# Patient Record
Sex: Female | Born: 1955 | Race: White | Hispanic: No | Marital: Married | Smoking: Former smoker
Health system: Southern US, Community
[De-identification: ages and names within clinical notes are randomized; demographics above are authoritative.]

## PROBLEM LIST (undated history)

## (undated) DIAGNOSIS — F32A Depression, unspecified: Secondary | ICD-10-CM

## (undated) DIAGNOSIS — F329 Major depressive disorder, single episode, unspecified: Secondary | ICD-10-CM

## (undated) DIAGNOSIS — D329 Benign neoplasm of meninges, unspecified: Secondary | ICD-10-CM

## (undated) DIAGNOSIS — R61 Generalized hyperhidrosis: Secondary | ICD-10-CM

## (undated) DIAGNOSIS — J349 Unspecified disorder of nose and nasal sinuses: Secondary | ICD-10-CM

## (undated) DIAGNOSIS — M1990 Unspecified osteoarthritis, unspecified site: Secondary | ICD-10-CM

## (undated) DIAGNOSIS — IMO0001 Reserved for inherently not codable concepts without codable children: Secondary | ICD-10-CM

## (undated) DIAGNOSIS — K219 Gastro-esophageal reflux disease without esophagitis: Secondary | ICD-10-CM

## (undated) HISTORY — DX: Gastro-esophageal reflux disease without esophagitis: K21.9

## (undated) HISTORY — PX: APPENDECTOMY: SHX54

## (undated) HISTORY — DX: Generalized hyperhidrosis: R61

## (undated) HISTORY — DX: Reserved for inherently not codable concepts without codable children: IMO0001

## (undated) HISTORY — PX: OTHER SURGICAL HISTORY: SHX169

## (undated) HISTORY — DX: Benign neoplasm of meninges, unspecified: D32.9

## (undated) HISTORY — DX: Unspecified disorder of nose and nasal sinuses: J34.9

## (undated) HISTORY — DX: Unspecified osteoarthritis, unspecified site: M19.90

## (undated) HISTORY — PX: TONSILLECTOMY AND ADENOIDECTOMY: SHX28

## (undated) HISTORY — PX: CHOLECYSTECTOMY: SHX55

---

## 2005-01-12 ENCOUNTER — Ambulatory Visit: Payer: Self-pay | Admitting: Internal Medicine

## 2013-09-04 ENCOUNTER — Encounter: Payer: Self-pay | Admitting: Podiatry

## 2013-09-16 ENCOUNTER — Encounter: Payer: Self-pay | Admitting: Podiatry

## 2013-09-16 ENCOUNTER — Ambulatory Visit (INDEPENDENT_AMBULATORY_CARE_PROVIDER_SITE_OTHER): Payer: BC Managed Care – PPO

## 2013-09-16 ENCOUNTER — Ambulatory Visit (INDEPENDENT_AMBULATORY_CARE_PROVIDER_SITE_OTHER): Payer: BC Managed Care – PPO | Admitting: Podiatry

## 2013-09-16 VITALS — BP 120/82 | HR 70 | Resp 16

## 2013-09-16 DIAGNOSIS — M722 Plantar fascial fibromatosis: Secondary | ICD-10-CM

## 2013-09-16 MED ORDER — TRIAMCINOLONE ACETONIDE 10 MG/ML IJ SUSP
10.0000 mg | Freq: Once | INTRAMUSCULAR | Status: AC
  Administered 2013-09-16: 10 mg

## 2013-09-16 NOTE — Patient Instructions (Signed)
Plantar Fasciitis (Heel Spur Syndrome) with Rehab The plantar fascia is a fibrous, ligament-like, soft-tissue structure that spans the bottom of the foot. Plantar fasciitis is a condition that causes pain in the foot due to inflammation of the tissue. SYMPTOMS   Pain and tenderness on the underneath side of the foot.  Pain that worsens with standing or walking. CAUSES  Plantar fasciitis is caused by irritation and injury to the plantar fascia on the underneath side of the foot. Common mechanisms of injury include:  Direct trauma to bottom of the foot.  Damage to a small nerve that runs under the foot where the main fascia attaches to the heel bone.  Stress placed on the plantar fascia due to bone spurs. RISK INCREASES WITH:   Activities that place stress on the plantar fascia (running, jumping, pivoting, or cutting).  Poor strength and flexibility.  Improperly fitted shoes.  Tight calf muscles.  Flat feet.  Failure to warm-up properly before activity.  Obesity. PREVENTION  Warm up and stretch properly before activity.  Allow for adequate recovery between workouts.  Maintain physical fitness:  Strength, flexibility, and endurance.  Cardiovascular fitness.  Maintain a health body weight.  Avoid stress on the plantar fascia.  Wear properly fitted shoes, including arch supports for individuals who have flat feet. PROGNOSIS  If treated properly, then the symptoms of plantar fasciitis usually resolve without surgery. However, occasionally surgery is necessary. RELATED COMPLICATIONS   Recurrent symptoms that may result in a chronic condition.  Problems of the lower back that are caused by compensating for the injury, such as limping.  Pain or weakness of the foot during push-off following surgery.  Chronic inflammation, scarring, and partial or complete fascia tear, occurring more often from repeated injections. TREATMENT  Treatment initially involves the use of  ice and medication to help reduce pain and inflammation. The use of strengthening and stretching exercises may help reduce pain with activity, especially stretches of the Achilles tendon. These exercises may be performed at home or with a therapist. Your caregiver may recommend that you use heel cups of arch supports to help reduce stress on the plantar fascia. Occasionally, corticosteroid injections are given to reduce inflammation. If symptoms persist for greater than 6 months despite non-surgical (conservative), then surgery may be recommended.  MEDICATION   If pain medication is necessary, then nonsteroidal anti-inflammatory medications, such as aspirin and ibuprofen, or other minor pain relievers, such as acetaminophen, are often recommended.  Do not take pain medication within 7 days before surgery.  Prescription pain relievers may be given if deemed necessary by your caregiver. Use only as directed and only as much as you need.  Corticosteroid injections may be given by your caregiver. These injections should be reserved for the most serious cases, because they may only be given a certain number of times. HEAT AND COLD  Cold treatment (icing) relieves pain and reduces inflammation. Cold treatment should be applied for 10 to 15 minutes every 2 to 3 hours for inflammation and pain and immediately after any activity that aggravates your symptoms. Use ice packs or massage the area with a piece of ice (ice massage).  Heat treatment may be used prior to performing the stretching and strengthening activities prescribed by your caregiver, physical therapist, or athletic trainer. Use a heat pack or soak the injury in warm water. SEEK IMMEDIATE MEDICAL CARE IF:  Treatment seems to offer no benefit, or the condition worsens.  Any medications produce adverse side effects. EXERCISES RANGE   OF MOTION (ROM) AND STRETCHING EXERCISES - Plantar Fasciitis (Heel Spur Syndrome) These exercises may help you  when beginning to rehabilitate your injury. Your symptoms may resolve with or without further involvement from your physician, physical therapist or athletic trainer. While completing these exercises, remember:   Restoring tissue flexibility helps normal motion to return to the joints. This allows healthier, less painful movement and activity.  An effective stretch should be held for at least 30 seconds.  A stretch should never be painful. You should only feel a gentle lengthening or release in the stretched tissue. RANGE OF MOTION - Toe Extension, Flexion  Sit with your right / left leg crossed over your opposite knee.  Grasp your toes and gently pull them back toward the top of your foot. You should feel a stretch on the bottom of your toes and/or foot.  Hold this stretch for __________ seconds.  Now, gently pull your toes toward the bottom of your foot. You should feel a stretch on the top of your toes and or foot.  Hold this stretch for __________ seconds. Repeat __________ times. Complete this stretch __________ times per day.  RANGE OF MOTION - Ankle Dorsiflexion, Active Assisted  Remove shoes and sit on a chair that is preferably not on a carpeted surface.  Place right / left foot under knee. Extend your opposite leg for support.  Keeping your heel down, slide your right / left foot back toward the chair until you feel a stretch at your ankle or calf. If you do not feel a stretch, slide your bottom forward to the edge of the chair, while still keeping your heel down.  Hold this stretch for __________ seconds. Repeat __________ times. Complete this stretch __________ times per day.  STRETCH - Gastroc, Standing  Place hands on wall.  Extend right / left leg, keeping the front knee somewhat bent.  Slightly point your toes inward on your back foot.  Keeping your right / left heel on the floor and your knee straight, shift your weight toward the wall, not allowing your back to  arch.  You should feel a gentle stretch in the right / left calf. Hold this position for __________ seconds. Repeat __________ times. Complete this stretch __________ times per day. STRETCH - Soleus, Standing  Place hands on wall.  Extend right / left leg, keeping the other knee somewhat bent.  Slightly point your toes inward on your back foot.  Keep your right / left heel on the floor, bend your back knee, and slightly shift your weight over the back leg so that you feel a gentle stretch deep in your back calf.  Hold this position for __________ seconds. Repeat __________ times. Complete this stretch __________ times per day. STRETCH - Gastrocsoleus, Standing  Note: This exercise can place a lot of stress on your foot and ankle. Please complete this exercise only if specifically instructed by your caregiver.   Place the ball of your right / left foot on a step, keeping your other foot firmly on the same step.  Hold on to the wall or a rail for balance.  Slowly lift your other foot, allowing your body weight to press your heel down over the edge of the step.  You should feel a stretch in your right / left calf.  Hold this position for __________ seconds.  Repeat this exercise with a slight bend in your right / left knee. Repeat __________ times. Complete this stretch __________ times per day.    STRENGTHENING EXERCISES - Plantar Fasciitis (Heel Spur Syndrome)  These exercises may help you when beginning to rehabilitate your injury. They may resolve your symptoms with or without further involvement from your physician, physical therapist or athletic trainer. While completing these exercises, remember:   Muscles can gain both the endurance and the strength needed for everyday activities through controlled exercises.  Complete these exercises as instructed by your physician, physical therapist or athletic trainer. Progress the resistance and repetitions only as guided. STRENGTH -  Towel Curls  Sit in a chair positioned on a non-carpeted surface.  Place your foot on a towel, keeping your heel on the floor.  Pull the towel toward your heel by only curling your toes. Keep your heel on the floor.  If instructed by your physician, physical therapist or athletic trainer, add ____________________ at the end of the towel. Repeat __________ times. Complete this exercise __________ times per day. STRENGTH - Ankle Inversion  Secure one end of a rubber exercise band/tubing to a fixed object (table, pole). Loop the other end around your foot just before your toes.  Place your fists between your knees. This will focus your strengthening at your ankle.  Slowly, pull your big toe up and in, making sure the band/tubing is positioned to resist the entire motion.  Hold this position for __________ seconds.  Have your muscles resist the band/tubing as it slowly pulls your foot back to the starting position. Repeat __________ times. Complete this exercises __________ times per day.  Document Released: 02/13/2005 Document Revised: 05/08/2011 Document Reviewed: 05/28/2008 [employer] Patient Information 2015 [employer]. This information is not intended to replace advice given to you by your health care provider. Make sure you discuss any questions you have with your health care provider.  

## 2013-09-18 NOTE — Progress Notes (Signed)
Subjective:     Patient ID: Krista Phelps, female   DOB: 07/04/1955, 58 y.o.   MRN: [id number]  HPI patient presents with significant recurrence of heel pain right stating that it hurts when she tries to walk on it and she feels like she has not may progress   Review of Systems     Objective:   Physical Exam Neurovascular status intact with significant recurrence of plantar fascia right heel at the insertional point of the tendon into the calcaneus    Assessment:     Reoccurrence of plantar fasciitis right heel with inflammation and fluid around the medial band    Plan:     Discussed condition and at this point advised on aggressive physical therapy shoe gear modification and scanned for custom orthotics to reduce the long-term stress on the heel and reduced mechanical forces. Did inject the right plantar fascia 3 mg Kenalog 5 mg I can Marcaine mixture at this time

## 2013-10-07 ENCOUNTER — Ambulatory Visit (INDEPENDENT_AMBULATORY_CARE_PROVIDER_SITE_OTHER): Payer: BC Managed Care – PPO | Admitting: *Deleted

## 2013-10-07 VITALS — BP 120/82 | HR 70 | Resp 16 | Ht 64.0 in | Wt 160.0 lb

## 2013-10-07 DIAGNOSIS — M722 Plantar fascial fibromatosis: Secondary | ICD-10-CM

## 2013-10-07 NOTE — Progress Notes (Signed)
Pt presents to pick up orthotics. Went over wearing instructions with pt. 

## 2013-10-07 NOTE — Patient Instructions (Signed)

## 2014-08-18 ENCOUNTER — Encounter: Payer: Self-pay | Admitting: Family Medicine

## 2014-08-18 ENCOUNTER — Ambulatory Visit (INDEPENDENT_AMBULATORY_CARE_PROVIDER_SITE_OTHER): Payer: BLUE CROSS/BLUE SHIELD | Admitting: Family Medicine

## 2014-08-18 ENCOUNTER — Telehealth: Payer: Self-pay | Admitting: Family Medicine

## 2014-08-18 VITALS — BP 142/81 | HR 79 | Temp 98.8°F | Ht 63.0 in | Wt 154.0 lb

## 2014-08-18 DIAGNOSIS — M5116 Intervertebral disc disorders with radiculopathy, lumbar region: Secondary | ICD-10-CM | POA: Diagnosis not present

## 2014-08-18 MED ORDER — PREDNISOLONE SODIUM PHOSPHATE 10 MG PO TBDP: 10.0000 mg | ORAL_TABLET | Freq: Every day | ORAL | Status: DC

## 2014-08-18 MED ORDER — PREDNISONE 10 MG PO TABS: 10.0000 mg | ORAL_TABLET | Freq: Every day | ORAL | Status: DC

## 2014-08-18 NOTE — Telephone Encounter (Signed)
Wrong predinsone called in

## 2014-08-18 NOTE — Progress Notes (Signed)
   BP 142/81 mmHg  Pulse 79  Temp(Src) 98.8 F (37.1 C)  Ht 5\' 3"  (1.6 m)  Wt 154 lb (69.854 kg)  BMI 27.29 kg/m2  SpO2 97%   Subjective:    Patient ID: Krista Phelps, female    DOB: 25-May-1955, 59 y.o.   MRN: [id number]  HPI: Krista Phelps is a 59 y.o. female  Chief Complaint  Patient presents with  . Sciatica    left side  see notes about back from [facility] Started on cruse ship with marked scitia prednisone helped some Started May 5th Nothing seemed to cause, no trauma  Relevant past medical, surgical, family and social history reviewed and updated as indicated. Interim medical history since our last visit reviewed. Allergies and medications reviewed and updated.  Review of Systems  Constitutional: Negative.   Cardiovascular: Negative.   Gastrointestinal: Negative.  Negative for nausea, abdominal pain, constipation and blood in stool.  Genitourinary: Negative for dysuria, hematuria and difficulty urinating.  Musculoskeletal: Positive for back pain and gait problem.    Per HPI unless specifically indicated above     Objective:    BP 142/81 mmHg  Pulse 79  Temp(Src) 98.8 F (37.1 C)  Ht 5\' 3"  (1.6 m)  Wt 154 lb (69.854 kg)  BMI 27.29 kg/m2  SpO2 97%  Wt Readings from Last 3 Encounters:  08/18/14 154 lb (69.854 kg)  10/07/13 160 lb (72.576 kg)    Physical Exam  Constitutional: She is oriented to person, place, and time. She appears well-developed and well-nourished. No distress.  HENT:  Head: Normocephalic and atraumatic.  Right Ear: Hearing normal.  Left Ear: Hearing normal.  Nose: Nose normal.  Eyes: Conjunctivae and lids are normal. Right eye exhibits no discharge. Left eye exhibits no discharge. No scleral icterus.  Pulmonary/Chest: Effort normal. No respiratory distress.  Musculoskeletal: Normal range of motion.  Lt sciatica sx DTR + at 45deg  Neurological: She is alert and oriented to person, place, and time.  Skin: Skin is intact. No rash noted.   Psychiatric: She has a normal mood and affect. Her speech is normal and behavior is normal. Judgment and thought content normal. Cognition and memory are normal.    No results found for this or any previous visit.    Assessment & Plan:   Problem List Items Addressed This Visit    None    Visit Diagnoses    Lumbar disc disease with radiculopathy    -  Primary    discuss back care  extension excersies predinsone  discuss red flags for caudia equina and calling 911    Relevant Medications    prednisoLONE (ORAPRED ODT) 10 MG disintegrating tablet        Follow up plan: Return if symptoms worsen or fail to improve.

## 2014-08-18 NOTE — Telephone Encounter (Signed)
[facility] on [address] called wanting to know if Prevnisolone can be changed to liquid form as they do not have the dissolving tablets. Please contact the pharmacy ASAP. Thanks.

## 2016-02-10 ENCOUNTER — Encounter: Payer: Self-pay | Admitting: Podiatry

## 2016-02-10 ENCOUNTER — Ambulatory Visit (INDEPENDENT_AMBULATORY_CARE_PROVIDER_SITE_OTHER): Payer: BLUE CROSS/BLUE SHIELD | Admitting: Podiatry

## 2016-02-10 VITALS — BP 142/80 | HR 77 | Resp 16

## 2016-02-10 DIAGNOSIS — B079 Viral wart, unspecified: Secondary | ICD-10-CM | POA: Diagnosis not present

## 2016-02-10 NOTE — Progress Notes (Signed)
Subjective:     Patient ID: Krista Phelps, female   DOB: 1955/04/10, 60 y.o.   MRN: [id number]  HPI patient presents with painful lesion plantar aspect right foot that is somewhat hard to walk on and she's had a history of these lesions in the past   Review of Systems     Objective:   Physical Exam Neurovascular status intact with lesion formation sub-metatarsal right that upon debridement shows pinpoint bleeding and pain to lateral pressure    Assessment:     Verruca plantaris plantar aspect right    Plan:     Debride lesion fully applied agent to create chemical response and immune response and applied sterile dressing and reappoint to recheck

## 2016-03-15 ENCOUNTER — Ambulatory Visit: Payer: BLUE CROSS/BLUE SHIELD | Admitting: Podiatry

## 2016-03-29 ENCOUNTER — Ambulatory Visit (INDEPENDENT_AMBULATORY_CARE_PROVIDER_SITE_OTHER): Payer: BLUE CROSS/BLUE SHIELD | Admitting: Podiatry

## 2016-03-29 ENCOUNTER — Encounter: Payer: Self-pay | Admitting: Podiatry

## 2016-03-29 DIAGNOSIS — B079 Viral wart, unspecified: Secondary | ICD-10-CM

## 2016-03-30 NOTE — Progress Notes (Signed)
Subjective:     Patient ID: Krista Phelps, female   DOB: November 26, 1955, 61 y.o.   MRN: [id number]  HPI patient states the lesions seem to be improved and I did get blistering but I have several new ones   Review of Systems     Objective:   Physical Exam Neurovascular status intact with approximate 4 lesions right foot one left foot that upon debridement show pinpoint bleeding pain to lateral pressure but they are definitely thinner than previous    Assessment:     Verruca plantaris bilateral with improvement    Plan:     Debridement of lesions accomplished and applied chemical agent to create an immune response was sterile dressings. Instructed what to do if any blistering were to occur

## 2018-01-16 ENCOUNTER — Ambulatory Visit: Payer: BLUE CROSS/BLUE SHIELD | Admitting: Family Medicine

## 2018-01-16 ENCOUNTER — Encounter: Payer: Self-pay | Admitting: Family Medicine

## 2018-01-16 DIAGNOSIS — J370 Chronic laryngitis: Secondary | ICD-10-CM | POA: Insufficient documentation

## 2018-01-16 DIAGNOSIS — F339 Major depressive disorder, recurrent, unspecified: Secondary | ICD-10-CM

## 2018-01-16 DIAGNOSIS — R252 Cramp and spasm: Secondary | ICD-10-CM | POA: Diagnosis not present

## 2018-01-16 DIAGNOSIS — M1612 Unilateral primary osteoarthritis, left hip: Secondary | ICD-10-CM | POA: Diagnosis not present

## 2018-01-16 LAB — URINALYSIS, ROUTINE W REFLEX MICROSCOPIC
Bilirubin, UA: NEGATIVE
GLUCOSE, UA: NEGATIVE
Leukocytes, UA: NEGATIVE
Nitrite, UA: NEGATIVE
PROTEIN UA: NEGATIVE
Specific Gravity, UA: >1.03 — ABNORMAL HIGH (ref 1.005–1.030)
Urobilinogen, Ur: 0.2 mg/dL (ref 0.2–1.0)
pH, UA: 5 (ref 5.0–7.5)

## 2018-01-16 LAB — MICROSCOPIC EXAMINATION: Bacteria, UA: NONE SEEN

## 2018-01-16 MED ORDER — MELOXICAM 15 MG PO TABS: 15.0000 mg | ORAL_TABLET | Freq: Every day | ORAL | 3 refills | Status: DC

## 2018-01-16 MED ORDER — FEXOFENADINE HCL 180 MG PO TABS: 180.0000 mg | ORAL_TABLET | Freq: Every day | ORAL | 12 refills | Status: DC

## 2018-01-16 MED ORDER — PANTOPRAZOLE SODIUM 40 MG PO TBEC: 40.0000 mg | DELAYED_RELEASE_TABLET | Freq: Every day | ORAL | 12 refills | Status: DC

## 2018-01-16 MED ORDER — SERTRALINE HCL 50 MG PO TABS: 50.0000 mg | ORAL_TABLET | Freq: Every day | ORAL | 3 refills | Status: DC

## 2018-01-16 NOTE — Assessment & Plan Note (Signed)
Discussed recurrent depression will start Zoloft 25 mg for 6 days then 50 mg daily.

## 2018-01-16 NOTE — Assessment & Plan Note (Signed)
Because of ongoing and chronic nature with worsening will give meloxicam and referral to orthopedics to further evaluate and x-ray etc.

## 2018-01-16 NOTE — Assessment & Plan Note (Signed)
Discuss laryngitis calls and treatment will start with stronger reflux treatment allergy treatment

## 2018-01-16 NOTE — Progress Notes (Signed)
BP (!) 159/92   Pulse 96   Temp 98 F (36.7 C) (Oral)   Wt 161 lb (73 kg)   SpO2 99%   BMI 28.52 kg/m    Subjective:    Patient ID: Krista Phelps, female    DOB: 10/31/1955, 62 y.o.   MRN: [id number]  HPI: Krista Phelps is a 62 y.o. female  Chief Complaint  Patient presents with  . Hip Pain    Left hip. Sometime shas to take 8 aleves to get through the day  . Nasal Congestion    In vocal chords. Was told she needed to see ENT but never did  . Spasms    in leg/feet bilateral. Mostly at night.   . Hand Pain    Right pointer. Aches.    Patient with some left hip pain in retrospect is been ongoing for 17+ years and getting worse over this time.  Especially gotten worse this fall with limping and walking with a bent over like she is pushing a grocery cart sensation.  It is become very painful to the point of taking 8 leaves to get through the day on some days. Patient has some radicular symptoms but has known degenerative disc disease of her back with radiation on her left leg.  This hip pain is not associated with this radicular pain.  Patient also with a very slow progression of frog or catch in her throat with some coughing and clearing of her throat required.  This is gradually gotten worse over the last 17 years or so.  Patient's noticed worsening with associated bronchitis. Has not tried medication.  Patient also with cramping especially in her feet and legs sometimes in her hand.  This cramping goes on especially after big weeks where she is been on her feet a lot or used her hands a lot at work.  Also feels like recurrence of depression is going on responded best to Zoloft in the past and wants to get back on that.  Relevant past medical, surgical, family and social history reviewed and updated as indicated. Interim medical history since our last visit reviewed. Allergies and medications reviewed and updated.  Review of Systems  Constitutional: Negative.   Respiratory:  Negative.   Cardiovascular: Negative.   Psychiatric/Behavioral: Negative for self-injury, sleep disturbance and suicidal ideas.    Per HPI unless specifically indicated above     Objective:    BP (!) 159/92   Pulse 96   Temp 98 F (36.7 C) (Oral)   Wt 161 lb (73 kg)   SpO2 99%   BMI 28.52 kg/m   Wt Readings from Last 3 Encounters:  01/16/18 161 lb (73 kg)  08/18/14 154 lb (69.9 kg)  10/07/13 160 lb (72.6 kg)    Physical Exam  Constitutional: She is oriented to person, place, and time. She appears well-developed and well-nourished.  HENT:  Head: Normocephalic and atraumatic.  Eyes: Conjunctivae and EOM are normal.  Neck: Normal range of motion.  Cardiovascular: Normal rate, regular rhythm and normal heart sounds.  Pulmonary/Chest: Effort normal and breath sounds normal.  Musculoskeletal: Normal range of motion.  Neurological: She is alert and oriented to person, place, and time.  Skin: No erythema.  Psychiatric: She has a normal mood and affect. Her behavior is normal. Judgment and thought content normal.    No results found for this or any previous visit.    Assessment & Plan:   Problem List Items Addressed This Visit  Respiratory   Chronic laryngitis    Discuss laryngitis calls and treatment will start with stronger reflux treatment allergy treatment      Relevant Orders   Comprehensive metabolic panel   CBC with Differential/Platelet   TSH   Urinalysis, Routine w reflex microscopic   Lipid panel     Musculoskeletal and Integument   Osteoarthritis of left hip    Because of ongoing and chronic nature with worsening will give meloxicam and referral to orthopedics to further evaluate and x-ray etc.      Relevant Medications   meloxicam (MOBIC) 15 MG tablet   Other Relevant Orders   Ambulatory referral to Orthopedic Surgery   Comprehensive metabolic panel   CBC with Differential/Platelet   TSH   Urinalysis, Routine w reflex microscopic   Lipid  panel     Other   Cramps of left lower extremity    Discussed cramping and care and treatment with massage stretching.  Also will check labs.      Relevant Orders   Comprehensive metabolic panel   CBC with Differential/Platelet   TSH   Urinalysis, Routine w reflex microscopic   Lipid panel   Depression, recurrent ([place])    Discussed recurrent depression will start Zoloft 25 mg for 6 days then 50 mg daily.      Relevant Medications   sertraline (ZOLOFT) 50 MG tablet       Follow up plan: Return in about 4 weeks (around 02/13/2018).

## 2018-01-16 NOTE — Assessment & Plan Note (Signed)
Discussed cramping and care and treatment with massage stretching.  Also will check labs.

## 2018-01-17 ENCOUNTER — Encounter: Payer: Self-pay | Admitting: Family Medicine

## 2018-01-17 LAB — COMPREHENSIVE METABOLIC PANEL
A/G RATIO: 2.3 — AB (ref 1.2–2.2)
ALT: 16 IU/L (ref 0–32)
AST: 17 IU/L (ref 0–40)
Albumin: 4.8 g/dL (ref 3.6–4.8)
Alkaline Phosphatase: 70 IU/L (ref 39–117)
BUN/Creatinine Ratio: 16 (ref 12–28)
BUN: 13 mg/dL (ref 8–27)
Bilirubin Total: <0.2 mg/dL (ref 0.0–1.2)
CALCIUM: 10.6 mg/dL — AB (ref 8.7–10.3)
CO2: 27 mmol/L (ref 20–29)
Chloride: 102 mmol/L (ref 96–106)
Creatinine, Ser: 0.8 mg/dL (ref 0.57–1.00)
GFR calc non Af Amer: 79 mL/min/{1.73_m2} (ref >59)
GFR, EST AFRICAN AMERICAN: 91 mL/min/{1.73_m2} (ref >59)
Globulin, Total: 2.1 g/dL (ref 1.5–4.5)
Glucose: 96 mg/dL (ref 65–99)
POTASSIUM: 4.3 mmol/L (ref 3.5–5.2)
Sodium: 145 mmol/L — ABNORMAL HIGH (ref 134–144)
TOTAL PROTEIN: 6.9 g/dL (ref 6.0–8.5)

## 2018-01-17 LAB — CBC WITH DIFFERENTIAL/PLATELET
Basophils Absolute: 0.1 10*3/uL (ref 0.0–0.2)
Basos: 1 %
EOS (ABSOLUTE): 0.3 10*3/uL (ref 0.0–0.4)
Eos: 5 %
HEMATOCRIT: 43.2 % (ref 34.0–46.6)
HEMOGLOBIN: 14.3 g/dL (ref 11.1–15.9)
IMMATURE GRANS (ABS): 0 10*3/uL (ref 0.0–0.1)
IMMATURE GRANULOCYTES: 0 %
LYMPHS ABS: 2.4 10*3/uL (ref 0.7–3.1)
Lymphs: 41 %
MCH: 30.3 pg (ref 26.6–33.0)
MCHC: 33.1 g/dL (ref 31.5–35.7)
MCV: 92 fL (ref 79–97)
Monocytes Absolute: 0.5 10*3/uL (ref 0.1–0.9)
Monocytes: 8 %
Neutrophils Absolute: 2.7 10*3/uL (ref 1.4–7.0)
Neutrophils: 45 %
Platelets: 272 10*3/uL (ref 150–450)
RBC: 4.72 x10E6/uL (ref 3.77–5.28)
RDW: 13.1 % (ref 12.3–15.4)
WBC: 6 10*3/uL (ref 3.4–10.8)

## 2018-01-17 LAB — LIPID PANEL
CHOLESTEROL TOTAL: 253 mg/dL — AB (ref 100–199)
Chol/HDL Ratio: 3.4 ratio (ref 0.0–4.4)
HDL: 74 mg/dL (ref >39)
LDL CALC: 123 mg/dL — AB (ref 0–99)
TRIGLYCERIDES: 282 mg/dL — AB (ref 0–149)
VLDL CHOLESTEROL CAL: 56 mg/dL — AB (ref 5–40)

## 2018-01-17 LAB — TSH: TSH: 1.8 u[IU]/mL (ref 0.450–4.500)

## 2018-01-22 DIAGNOSIS — M1612 Unilateral primary osteoarthritis, left hip: Secondary | ICD-10-CM | POA: Diagnosis not present

## 2018-02-04 ENCOUNTER — Telehealth: Payer: Self-pay | Admitting: Family Medicine

## 2018-02-04 DIAGNOSIS — M1612 Unilateral primary osteoarthritis, left hip: Secondary | ICD-10-CM

## 2018-02-04 NOTE — Telephone Encounter (Signed)
Copied from [place] [id number]. Topic: Referral - Request for Referral >> Jan 23, 2018  2:47 PM Yvette Rack wrote: Has patient seen PCP for this complaint? yes  *If NO, is insurance requiring patient see PCP for this issue before PCP can refer them? Referral for which specialty: Orthopedic Surgeon Preferred provider/office: No specific provider Reason for referral: Pt stated this will be a lifestyle change so she requests a second opinion >> Jan 28, 2018 10:59 AM Guadalupe Maple, MD wrote: I do not have anything from orthopedics in the chart.  What are you referring to? >> Feb 01, 2018 12:24 PM Selinda Flavin B, NT wrote: Patient calling to check on referral that she requested. States that she was referred to Dr Harlow Mares at [facility] on 01/16/18 by Dr Jeananne Rama. Dr Harlow Mares saw her and recommends a total hip replacement (anterior approach) and she would like a 2nd opinion. So, she is needing a new referral for another orthopedic for 2nd opinion. Please advise.  CB#: [phone]

## 2018-02-13 ENCOUNTER — Encounter: Payer: Self-pay | Admitting: Family Medicine

## 2018-02-13 ENCOUNTER — Ambulatory Visit (INDEPENDENT_AMBULATORY_CARE_PROVIDER_SITE_OTHER): Payer: Commercial Managed Care - PPO | Admitting: Family Medicine

## 2018-02-13 DIAGNOSIS — J370 Chronic laryngitis: Secondary | ICD-10-CM

## 2018-02-13 DIAGNOSIS — F339 Major depressive disorder, recurrent, unspecified: Secondary | ICD-10-CM | POA: Diagnosis not present

## 2018-02-13 MED ORDER — SERTRALINE HCL 100 MG PO TABS: 100.0000 mg | ORAL_TABLET | Freq: Every day | ORAL | 3 refills | Status: DC

## 2018-02-13 MED ORDER — BENZONATATE 100 MG PO CAPS: 100.0000 mg | ORAL_CAPSULE | Freq: Two times a day (BID) | ORAL | 0 refills | Status: DC | PRN

## 2018-02-13 NOTE — Addendum Note (Signed)
Addended by: Golden Pop A on: 02/13/2018 11:41 AM   Modules accepted: Orders

## 2018-02-13 NOTE — Assessment & Plan Note (Signed)
Because of ongoing chronic nature will refer to ear nose and throat.

## 2018-02-13 NOTE — Progress Notes (Signed)
BP 122/69 (BP Location: Left Arm, Patient Position: Sitting, Cuff Size: Normal)   Pulse 79   Temp 98.4 F (36.9 C) (Oral)   Ht 5\' 4"  (1.626 m)   Wt 163 lb 6.4 oz (74.1 kg)   SpO2 95%   BMI 28.05 kg/m    Subjective:    Patient ID: Krista Phelps, female    DOB: Mar 21, 1955, 62 y.o.   MRN: [id number]  HPI: Krista Phelps is a 62 y.o. female  Chief Complaint  Patient presents with  . Depression    4 week F/U  . Osteoarthritis  . Cough    Ongoing 3 days  . Nasal Congestion  Patient with ongoing chronic hoarseness which comes and goes.  It will go with swallowing or clearing her throat or coughing then come right back.  Has been steadily getting worse over this last year. Discussed depression and nerves which are significantly improved but still some residual symptoms patient reports about 70% improved from her low.  Relevant past medical, surgical, family and social history reviewed and updated as indicated. Interim medical history since our last visit reviewed. Allergies and medications reviewed and updated.  Review of Systems  Constitutional: Negative.   Respiratory: Negative.   Cardiovascular: Negative.     Per HPI unless specifically indicated above     Objective:    BP 122/69 (BP Location: Left Arm, Patient Position: Sitting, Cuff Size: Normal)   Pulse 79   Temp 98.4 F (36.9 C) (Oral)   Ht 5\' 4"  (1.626 m)   Wt 163 lb 6.4 oz (74.1 kg)   SpO2 95%   BMI 28.05 kg/m   Wt Readings from Last 3 Encounters:  02/13/18 163 lb 6.4 oz (74.1 kg)  01/16/18 161 lb (73 kg)  08/18/14 154 lb (69.9 kg)    Physical Exam Constitutional:      Appearance: She is well-developed.  HENT:     Head: Normocephalic and atraumatic.  Eyes:     Conjunctiva/sclera: Conjunctivae normal.  Neck:     Musculoskeletal: Normal range of motion.  Cardiovascular:     Rate and Rhythm: Normal rate and regular rhythm.     Heart sounds: Normal heart sounds.  Pulmonary:     Effort: Pulmonary  effort is normal.     Breath sounds: Normal breath sounds.  Musculoskeletal: Normal range of motion.  Skin:    Findings: No erythema.  Neurological:     Mental Status: She is alert and oriented to person, place, and time.  Psychiatric:        Behavior: Behavior normal.        Thought Content: Thought content normal.        Judgment: Judgment normal.     Results for orders placed or performed in visit on 01/16/18  Microscopic Examination  Result Value Ref Range   WBC, UA 0-5 0 - 5 /hpf   RBC, UA 0-2 0 - 2 /hpf   Epithelial Cells (non renal) 0-10 0 - 10 /hpf   Bacteria, UA None seen None seen/Few  Comprehensive metabolic panel  Result Value Ref Range   Glucose 96 65 - 99 mg/dL   BUN 13 8 - 27 mg/dL   Creatinine, Ser 0.80 0.57 - 1.00 mg/dL   GFR calc non Af Amer 79 >59 mL/min/1.73   GFR calc Af Amer 91 >59 mL/min/1.73   BUN/Creatinine Ratio 16 12 - 28   Sodium 145 (H) 134 - 144 mmol/L   Potassium 4.3 3.5 -  5.2 mmol/L   Chloride 102 96 - 106 mmol/L   CO2 27 20 - 29 mmol/L   Calcium 10.6 (H) 8.7 - 10.3 mg/dL   Total Protein 6.9 6.0 - 8.5 g/dL   Albumin 4.8 3.6 - 4.8 g/dL   Globulin, Total 2.1 1.5 - 4.5 g/dL   Albumin/Globulin Ratio 2.3 (H) 1.2 - 2.2   Bilirubin Total <0.2 0.0 - 1.2 mg/dL   Alkaline Phosphatase 70 39 - 117 IU/L   AST 17 0 - 40 IU/L   ALT 16 0 - 32 IU/L  CBC with Differential/Platelet  Result Value Ref Range   WBC 6.0 3.4 - 10.8 x10E3/uL   RBC 4.72 3.77 - 5.28 x10E6/uL   Hemoglobin 14.3 11.1 - 15.9 g/dL   Hematocrit 43.2 34.0 - 46.6 %   MCV 92 79 - 97 fL   MCH 30.3 26.6 - 33.0 pg   MCHC 33.1 31.5 - 35.7 g/dL   RDW 13.1 12.3 - 15.4 %   Platelets 272 150 - 450 x10E3/uL   Neutrophils 45 Not Estab. %   Lymphs 41 Not Estab. %   Monocytes 8 Not Estab. %   Eos 5 Not Estab. %   Basos 1 Not Estab. %   Neutrophils Absolute 2.7 1.4 - 7.0 x10E3/uL   Lymphocytes Absolute 2.4 0.7 - 3.1 x10E3/uL   Monocytes Absolute 0.5 0.1 - 0.9 x10E3/uL   EOS (ABSOLUTE) 0.3  0.0 - 0.4 x10E3/uL   Basophils Absolute 0.1 0.0 - 0.2 x10E3/uL   Immature Granulocytes 0 Not Estab. %   Immature Grans (Abs) 0.0 0.0 - 0.1 x10E3/uL  TSH  Result Value Ref Range   TSH 1.800 0.450 - 4.500 uIU/mL  Urinalysis, Routine w reflex microscopic  Result Value Ref Range   Specific Gravity, UA >1.030 (H) 1.005 - 1.030   pH, UA 5.0 5.0 - 7.5   Color, UA Orange Yellow   Appearance Ur Clear Clear   Leukocytes, UA Negative Negative   Protein, UA Negative Negative/Trace   Glucose, UA Negative Negative   Ketones, UA 1+ (A) Negative   RBC, UA Trace (A) Negative   Bilirubin, UA Negative Negative   Urobilinogen, Ur 0.2 0.2 - 1.0 mg/dL   Nitrite, UA Negative Negative   Microscopic Examination See below:   Lipid panel  Result Value Ref Range   Cholesterol, Total 253 (H) 100 - 199 mg/dL   Triglycerides 282 (H) 0 - 149 mg/dL   HDL 74 >39 mg/dL   VLDL Cholesterol Cal 56 (H) 5 - 40 mg/dL   LDL Calculated 123 (H) 0 - 99 mg/dL   Chol/HDL Ratio 3.4 0.0 - 4.4 ratio      Assessment & Plan:   Problem List Items Addressed This Visit      Respiratory   Chronic laryngitis    Because of ongoing chronic nature will refer to ear nose and throat.      Relevant Orders   Ambulatory referral to ENT     Other   Depression, recurrent ([place])    Discussed depression care and treatment will increase sertraline from 50 mg to 100 mg      Relevant Medications   sertraline (ZOLOFT) 100 MG tablet       Follow up plan: Return in about 3 months (around 05/15/2018).

## 2018-02-13 NOTE — Assessment & Plan Note (Signed)
Discussed depression care and treatment will increase sertraline from 50 mg to 100 mg

## 2018-02-25 DIAGNOSIS — M25551 Pain in right hip: Secondary | ICD-10-CM | POA: Diagnosis not present

## 2018-02-25 DIAGNOSIS — M25552 Pain in left hip: Secondary | ICD-10-CM | POA: Diagnosis not present

## 2018-02-25 DIAGNOSIS — G8929 Other chronic pain: Secondary | ICD-10-CM | POA: Diagnosis not present

## 2018-02-25 DIAGNOSIS — M1612 Unilateral primary osteoarthritis, left hip: Secondary | ICD-10-CM | POA: Diagnosis not present

## 2018-03-06 DIAGNOSIS — K219 Gastro-esophageal reflux disease without esophagitis: Secondary | ICD-10-CM | POA: Diagnosis not present

## 2018-03-06 DIAGNOSIS — R490 Dysphonia: Secondary | ICD-10-CM | POA: Diagnosis not present

## 2018-03-06 DIAGNOSIS — J301 Allergic rhinitis due to pollen: Secondary | ICD-10-CM | POA: Diagnosis not present

## 2018-03-20 ENCOUNTER — Other Ambulatory Visit: Payer: Self-pay

## 2018-03-20 ENCOUNTER — Encounter
Admission: RE | Admit: 2018-03-20 | Discharge: 2018-03-20 | Disposition: A | Discharge status: Home / Self Care | Payer: Commercial Managed Care - PPO | Source: Ambulatory Visit | Attending: Orthopedic Surgery | Admitting: Orthopedic Surgery

## 2018-03-20 DIAGNOSIS — Z01812 Encounter for preprocedural laboratory examination: Secondary | ICD-10-CM | POA: Insufficient documentation

## 2018-03-20 HISTORY — DX: Major depressive disorder, single episode, unspecified: F32.9

## 2018-03-20 HISTORY — DX: Depression, unspecified: F32.A

## 2018-03-20 LAB — URINALYSIS, ROUTINE W REFLEX MICROSCOPIC
Bilirubin Urine: NEGATIVE
Glucose, UA: NEGATIVE mg/dL
Hgb urine dipstick: NEGATIVE
Ketones, ur: NEGATIVE mg/dL
LEUKOCYTES UA: NEGATIVE
Nitrite: NEGATIVE
Protein, ur: NEGATIVE mg/dL
Specific Gravity, Urine: 1.011 (ref 1.005–1.030)
pH: 6 (ref 5.0–8.0)

## 2018-03-20 LAB — TYPE AND SCREEN
ABO/RH(D): O POS
Antibody Screen: NEGATIVE

## 2018-03-20 LAB — PROTIME-INR
INR: 0.89
Prothrombin Time: 12 seconds (ref 11.4–15.2)

## 2018-03-20 LAB — SURGICAL PCR SCREEN
MRSA, PCR: NEGATIVE
Staphylococcus aureus: NEGATIVE

## 2018-03-20 LAB — SEDIMENTATION RATE: SED RATE: 4 mm/h (ref 0–22)

## 2018-03-20 LAB — APTT: APTT: 29 s (ref 24–36)

## 2018-03-20 NOTE — Patient Instructions (Signed)
Your procedure is scheduled on: April 02, 2018 TUESDAY Report to Day Surgery on the 2nd floor of the [facility]. To find out your arrival time, please call [phone] between 1PM - 3PM on: Monday FEBRUAR 3, 2020  REMEMBER: Instructions that are not followed completely may result in serious medical risk, up to and including death; or upon the discretion of your surgeon and anesthesiologist your surgery may need to be rescheduled.  Do not eat food after midnight the night before surgery.  No gum chewing, lozengers or hard candies.  You may however, drink CLEAR liquids up to 2 hours before you are scheduled to arrive for your surgery. Do not drink anything within 2 hours of the start of your surgery.  Clear liquids include: - water  - apple juice without pulp -CLEAR gatorade - black coffee or tea (Do NOT add milk or creamers to the coffee or tea) Do NOT drink anything that is not on this list.  Type 1 and Type 2 diabetics should only drink water.  No Alcohol for 24 hours before or after surgery.  No Smoking including e-cigarettes for 24 hours prior to surgery.  No chewable tobacco products for at least 6 hours prior to surgery.  No nicotine patches on the day of surgery.  On the morning of surgery brush your teeth with toothpaste and water, you may rinse your mouth with mouthwash if you wish. Do not swallow any toothpaste or mouthwash.  Notify your doctor if there is any change in your medical condition (cold, fever, infection).  Do not wear jewelry, make-up, hairpins, clips or nail polish.  Do not wear lotions, powders, or perfumes.   Do not shave 48 hours prior to surgery.   Contacts and dentures may not be worn into surgery.  Do not bring valuables to the hospital, including drivers license, insurance or credit cards.  [facility] is not responsible for any belongings or valuables.   TAKE THESE MEDICATIONS THE MORNING OF SURGERY: MAGNESIUM PROTONIX (take one the  night before and one on the morning of surgery - helps to prevent nausea after surgery.)  Use CHG Soap  as directed on instruction sheet.  Follow recommendations from Cardiologist, Pulmonologist or PCP regarding stopping Aspirin LAST DOSE 03-25-2018  Stop Anti-inflammatories (NSAIDS) such as Advil, Aleve, Ibuprofen, Motrin, Naproxen, Naprosyn and Aspirin based products such as Excedrin, Goodys Powder, BC Powder AND MELOXICAM LAST DOSE 03-25-2018 (May take Tylenol or Acetaminophen if needed.)  Stop ANY OVER THE COUNTER supplements until after surgery MELATONIN, VIT C (May continue Vitamin D, Vitamin B, and multivitamin.MAGNESIUM )  Wear comfortable clothing (specific to your surgery type) to the hospital.  Plan for stool softeners for home use.  If you are being admitted to the hospital overnight, leave your suitcase in the car. After surgery it may be brought to your room.  If you are being discharged the day of surgery, you will not be allowed to drive home. You will need a responsible adult to drive you home and stay with you that night.   If you are taking public transportation, you will need to have a responsible adult with you. Please confirm with your physician that it is acceptable to use public transportation.   Please call [phone] if you have any questions about these instructions.

## 2018-03-21 LAB — URINE CULTURE: CULTURE: NO GROWTH

## 2018-04-01 MED ORDER — CEFAZOLIN SODIUM-DEXTROSE 2-4 GM/100ML-% IV SOLN
2.0000 g | Freq: Once | INTRAVENOUS | Status: AC
  Administered 2018-04-02: 2 g via INTRAVENOUS

## 2018-04-02 ENCOUNTER — Inpatient Hospital Stay: Payer: Commercial Managed Care - PPO | Admitting: Anesthesiology

## 2018-04-02 ENCOUNTER — Encounter
Admission: RE | Disposition: A | Discharge status: Home Health | Payer: Self-pay | Source: Home / Self Care | Attending: Orthopedic Surgery

## 2018-04-02 ENCOUNTER — Inpatient Hospital Stay
Admission: RE | Admit: 2018-04-02 | Discharge: 2018-04-05 | DRG: 470 | Disposition: A | Discharge status: Home Health | Payer: Commercial Managed Care - PPO | Attending: Orthopedic Surgery | Admitting: Orthopedic Surgery

## 2018-04-02 ENCOUNTER — Encounter: Payer: Self-pay | Admitting: *Deleted

## 2018-04-02 ENCOUNTER — Other Ambulatory Visit: Payer: Self-pay

## 2018-04-02 ENCOUNTER — Inpatient Hospital Stay: Payer: Commercial Managed Care - PPO

## 2018-04-02 DIAGNOSIS — Z87891 Personal history of nicotine dependence: Secondary | ICD-10-CM | POA: Diagnosis not present

## 2018-04-02 DIAGNOSIS — Z791 Long term (current) use of non-steroidal anti-inflammatories (NSAID): Secondary | ICD-10-CM

## 2018-04-02 DIAGNOSIS — Z419 Encounter for procedure for purposes other than remedying health state, unspecified: Secondary | ICD-10-CM

## 2018-04-02 DIAGNOSIS — K219 Gastro-esophageal reflux disease without esophagitis: Secondary | ICD-10-CM | POA: Diagnosis present

## 2018-04-02 DIAGNOSIS — G8918 Other acute postprocedural pain: Secondary | ICD-10-CM

## 2018-04-02 DIAGNOSIS — M25552 Pain in left hip: Secondary | ICD-10-CM | POA: Diagnosis present

## 2018-04-02 DIAGNOSIS — M1612 Unilateral primary osteoarthritis, left hip: Secondary | ICD-10-CM | POA: Diagnosis not present

## 2018-04-02 DIAGNOSIS — F329 Major depressive disorder, single episode, unspecified: Secondary | ICD-10-CM | POA: Diagnosis present

## 2018-04-02 DIAGNOSIS — Z7982 Long term (current) use of aspirin: Secondary | ICD-10-CM | POA: Diagnosis not present

## 2018-04-02 DIAGNOSIS — Z79899 Other long term (current) drug therapy: Secondary | ICD-10-CM | POA: Diagnosis not present

## 2018-04-02 DIAGNOSIS — Z96642 Presence of left artificial hip joint: Secondary | ICD-10-CM

## 2018-04-02 HISTORY — DX: Gastro-esophageal reflux disease without esophagitis: K21.9

## 2018-04-02 HISTORY — PX: TOTAL HIP ARTHROPLASTY: SHX124

## 2018-04-02 LAB — CBC
HCT: 38.2 % (ref 36.0–46.0)
Hemoglobin: 12.6 g/dL (ref 12.0–15.0)
MCH: 30.2 pg (ref 26.0–34.0)
MCHC: 33 g/dL (ref 30.0–36.0)
MCV: 91.6 fL (ref 80.0–100.0)
Platelets: 178 10*3/uL (ref 150–400)
RBC: 4.17 MIL/uL (ref 3.87–5.11)
RDW: 13 % (ref 11.5–15.5)
WBC: 7.4 10*3/uL (ref 4.0–10.5)
nRBC: 0 % (ref 0.0–0.2)

## 2018-04-02 LAB — CREATININE, SERUM
Creatinine, Ser: 0.65 mg/dL (ref 0.44–1.00)
GFR calc Af Amer: >60 mL/min (ref >60)
GFR calc non Af Amer: >60 mL/min (ref >60)

## 2018-04-02 LAB — ABO/RH: ABO/RH(D): O POS

## 2018-04-02 IMAGING — XA DG HIP (WITH PELVIS) OPERATIVE*L*
3 series · 9 of 9 positions shown · non-contrast
Comparison: None.

CLINICAL DATA: Left hip replacement

EXAM:
OPERATIVE LEFT HIP (WITH PELVIS IF PERFORMED) 3 VIEWS
TECHNIQUE: Fluoroscopic spot image(s) were submitted for interpretation
post-operatively.

[Series 6: ortho standard · 1 of 1 slices shown (1 of 3)]
[im 1/1]
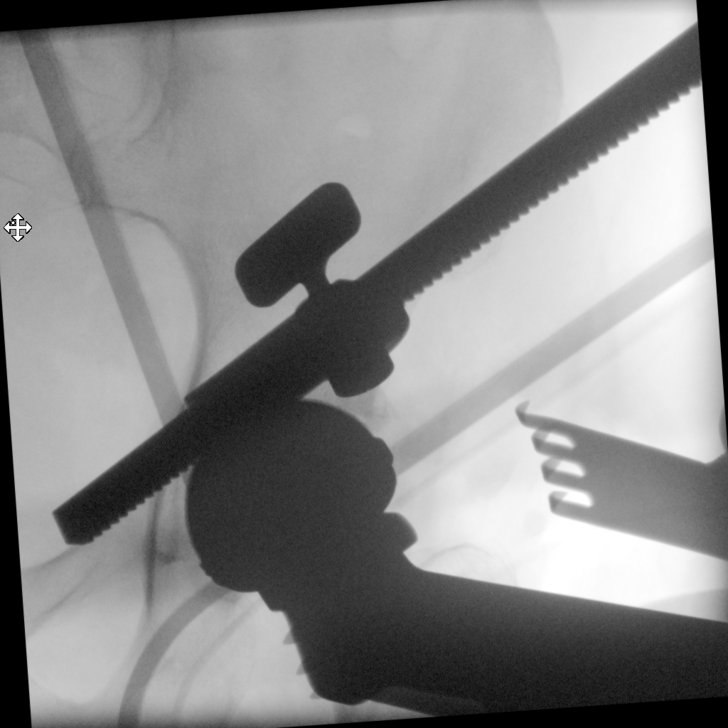

[Series 9: ortho standard · 4 of 13 frames shown (2 of 3)]
[frame 2/13]
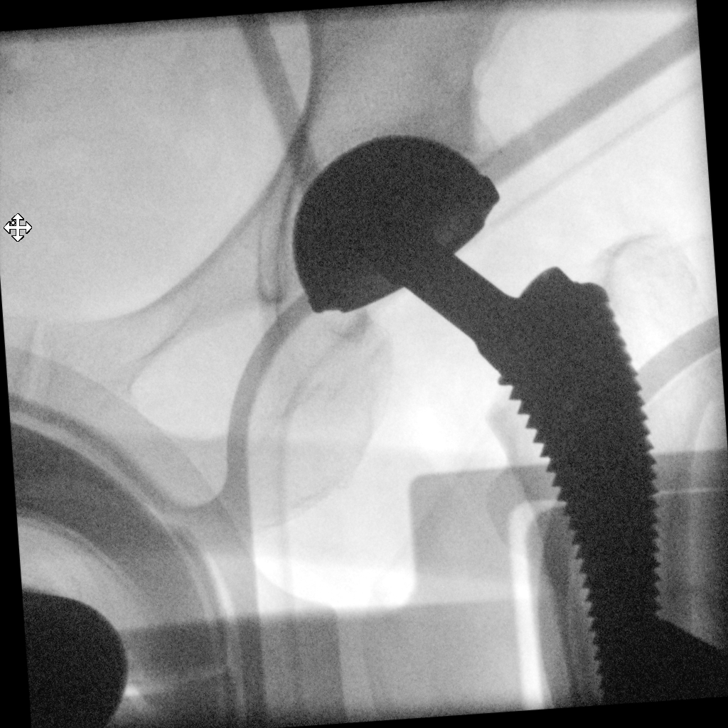
[frame 7/13]
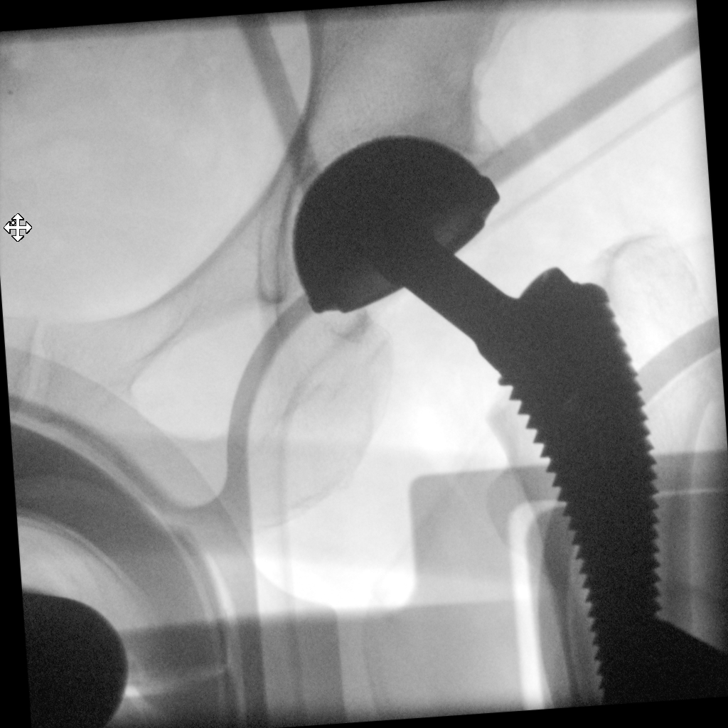
[frame 12/13]
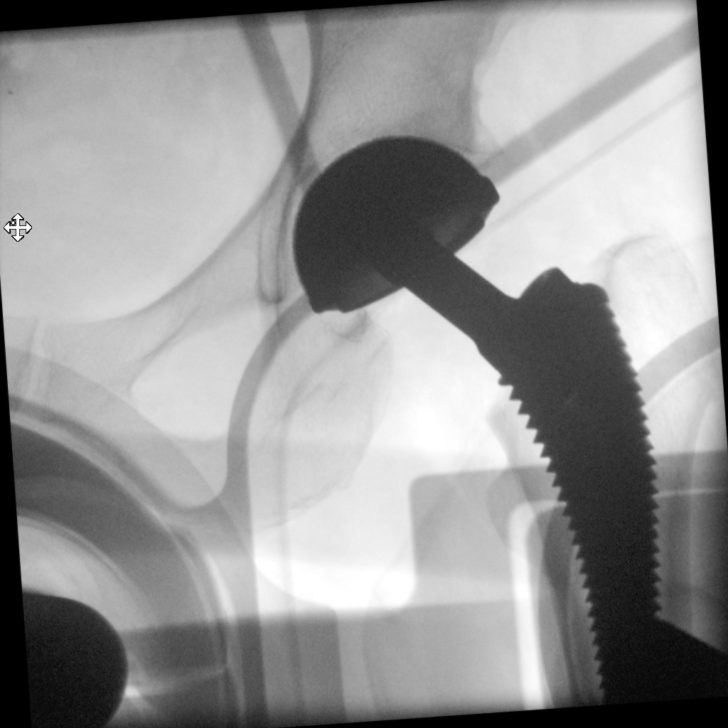
[frame 13/13]
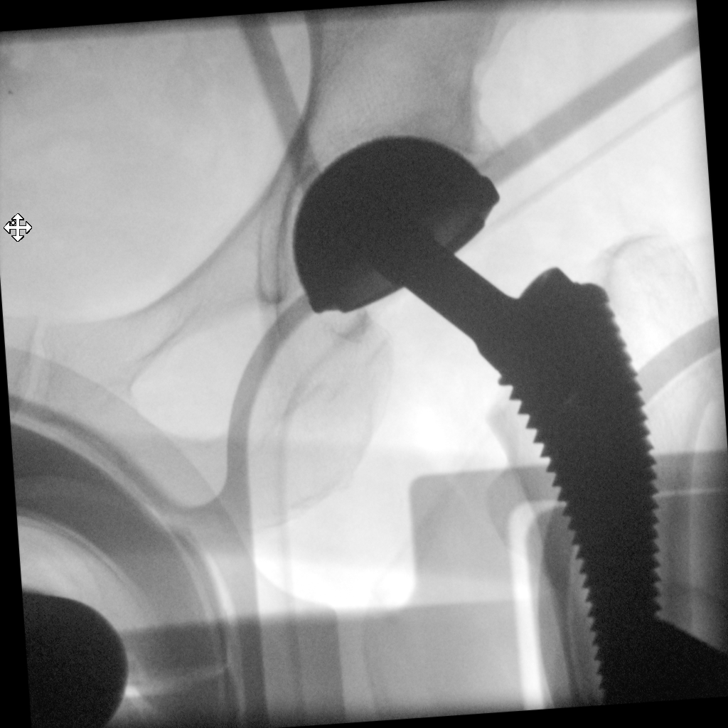

[Series 11: ortho standard · 4 of 7 frames shown (3 of 3)]
[frame 2/7]
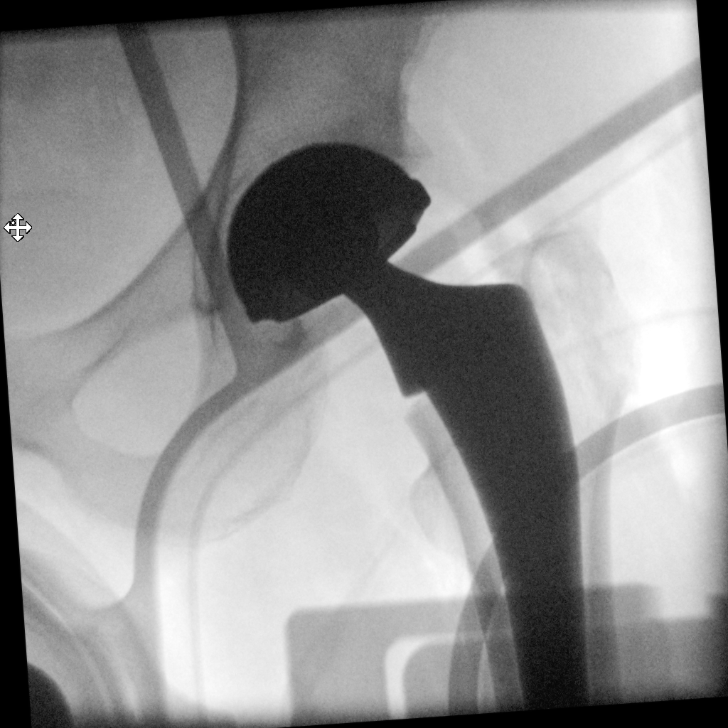
[frame 3/7]
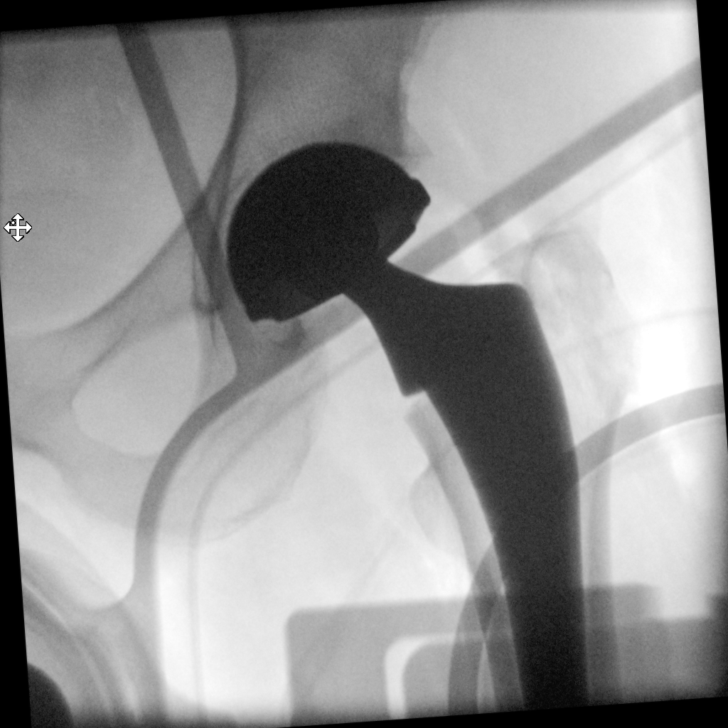
[frame 4/7]
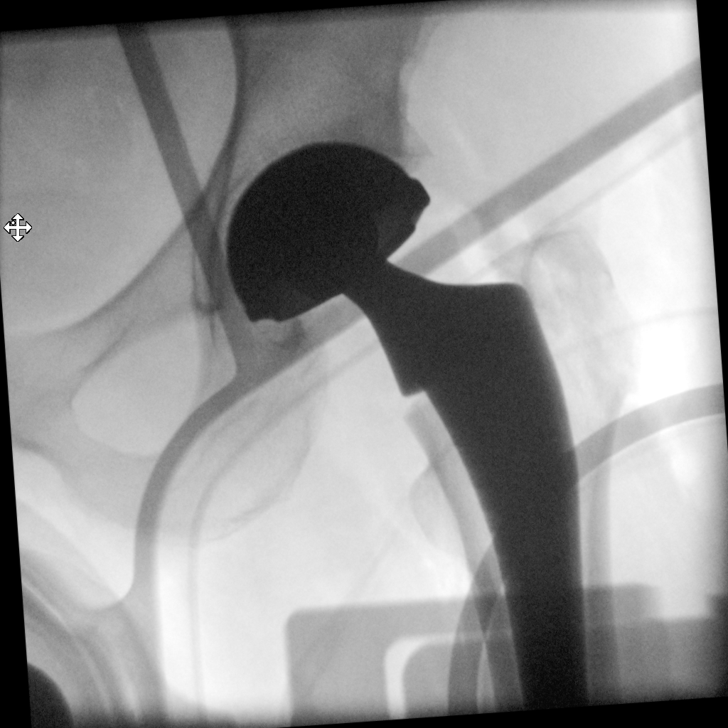
[frame 6/7]
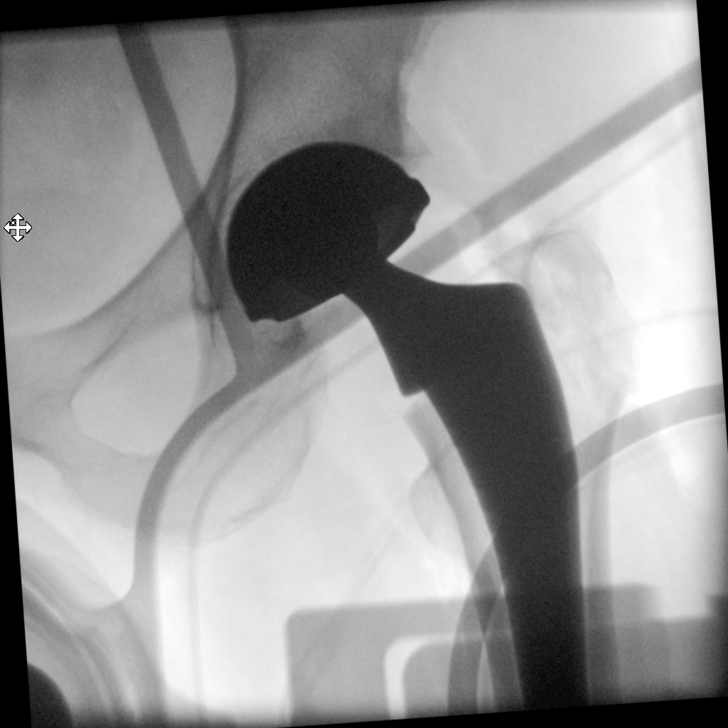

[9 of 9 positions shown; findings below may reference images not displayed]

FINDINGS: Three intraoperative spot images demonstrate changes of left hip
replacement. No visible hardware or bony complicating feature.
Normal alignment.
IMPRESSION: Left hip replacement.  No visible complicating feature.

## 2018-04-02 IMAGING — DX DG HIP (WITH OR WITHOUT PELVIS) 2-3V*L*
2 series · 2 of 2 positions shown · non-contrast
Comparison: Portable exam at [CO] hours compared to earlier
intraoperative images of [DATE]

CLINICAL DATA: Postoperative pain

EXAM:
DG HIP (WITH OR WITHOUT PELVIS) 2-3V LEFT

[hip ap]
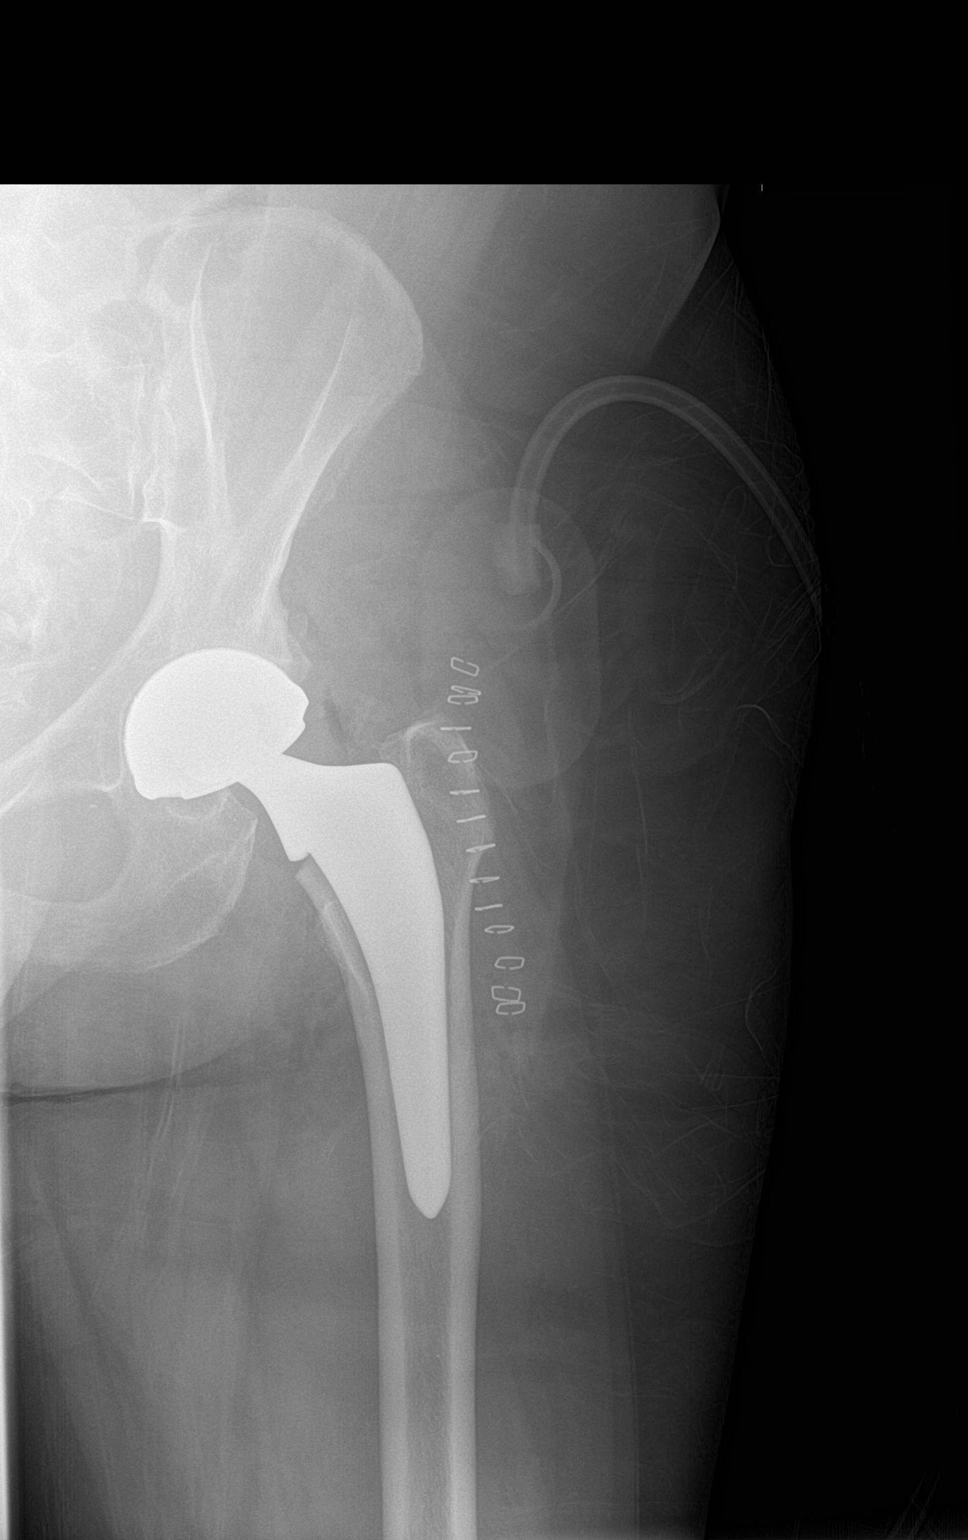

[hip lat]
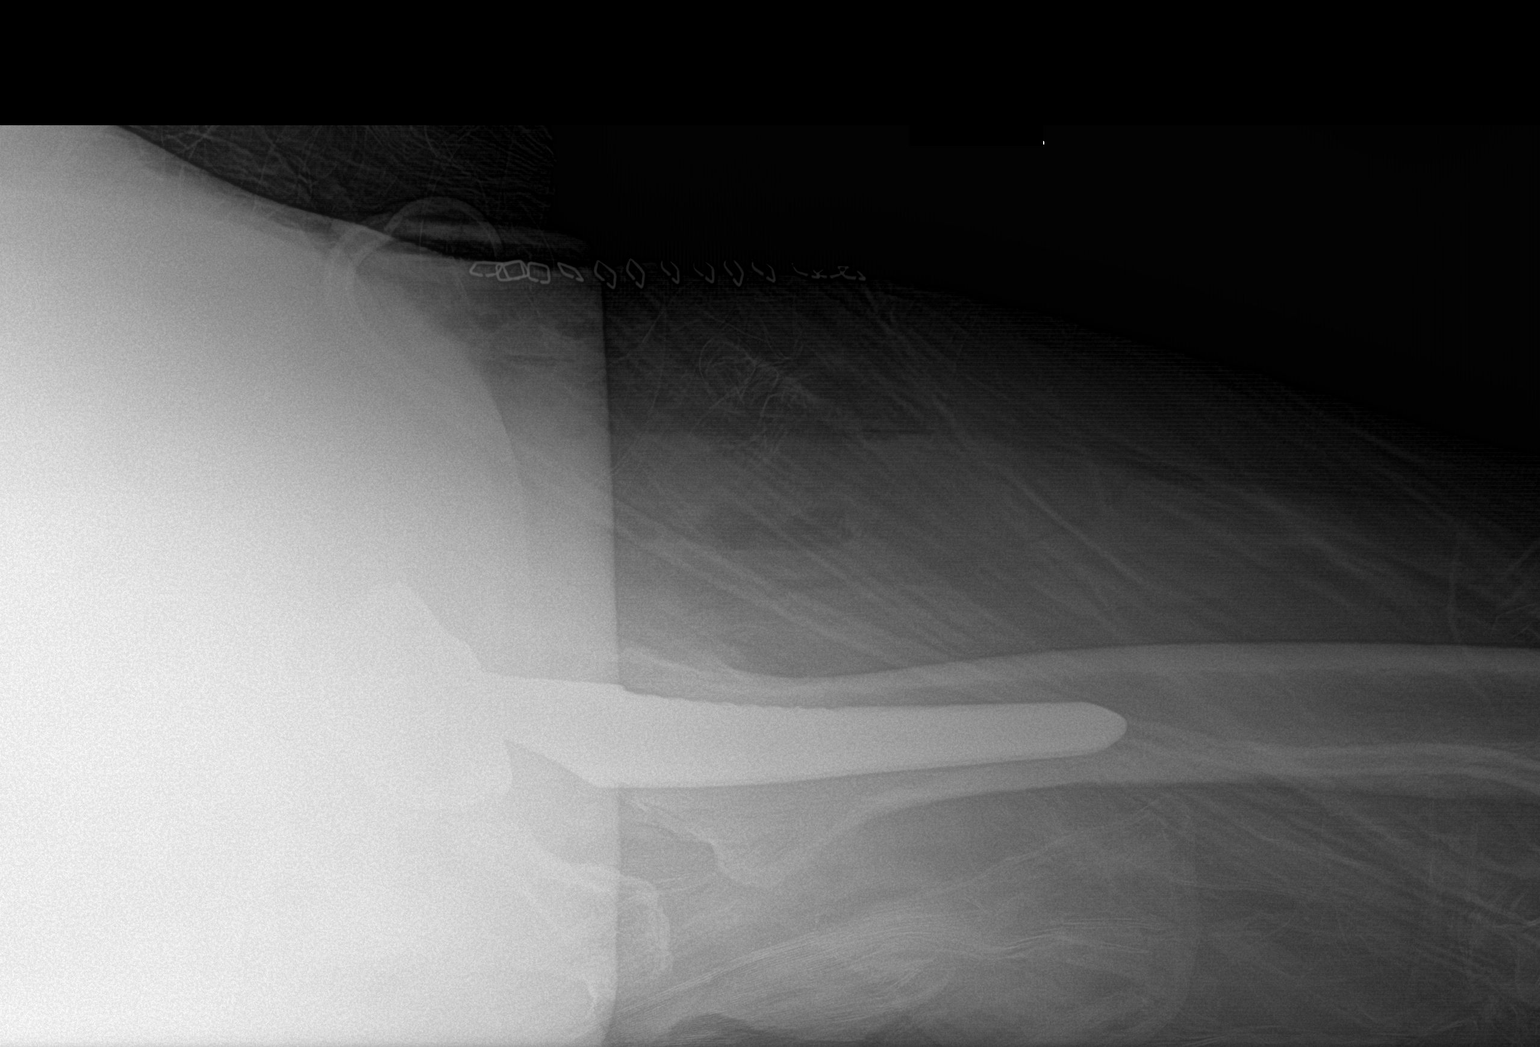

[2 of 2 positions shown; findings below may reference images not displayed]

FINDINGS: LEFT hip prosthesis identified in expected position.

No fracture, dislocation, or bone destruction.

Skin clips and a wound VAC project over the operative site.
IMPRESSION: LEFT hip prosthesis without acute complication.

## 2018-04-02 SURGERY — ARTHROPLASTY, HIP, TOTAL, ANTERIOR APPROACH
Anesthesia: Spinal | Laterality: Left

## 2018-04-02 MED ORDER — PROPOFOL 500 MG/50ML IV EMUL
INTRAVENOUS | Status: AC
  Filled 2018-04-02: qty 50

## 2018-04-02 MED ORDER — MELATONIN 5 MG PO TABS
10.0000 mg | ORAL_TABLET | Freq: Every day | ORAL | Status: DC
  Administered 2018-04-02 – 2018-04-04 (×3): 10 mg via ORAL
  Filled 2018-04-02 (×4): qty 2

## 2018-04-02 MED ORDER — MORPHINE SULFATE (PF) 2 MG/ML IV SOLN
0.5000 mg | INTRAVENOUS | Status: DC | PRN
  Administered 2018-04-02: 0.5 mg via INTRAVENOUS
  Filled 2018-04-02: qty 1

## 2018-04-02 MED ORDER — MAGNESIUM HYDROXIDE 400 MG/5ML PO SUSP: 30.0000 mL | Freq: Every day | ORAL | Status: DC | PRN

## 2018-04-02 MED ORDER — CEFAZOLIN SODIUM-DEXTROSE 1-4 GM/50ML-% IV SOLN
1.0000 g | Freq: Four times a day (QID) | INTRAVENOUS | Status: AC
  Administered 2018-04-02 – 2018-04-03 (×3): 1 g via INTRAVENOUS
  Filled 2018-04-02 (×3): qty 50

## 2018-04-02 MED ORDER — ESMOLOL HCL 100 MG/10ML IV SOLN
INTRAVENOUS | Status: AC
  Filled 2018-04-02: qty 10

## 2018-04-02 MED ORDER — ESMOLOL HCL 100 MG/10ML IV SOLN
INTRAVENOUS | Status: DC | PRN
  Administered 2018-04-02: 10 mg via INTRAVENOUS
  Administered 2018-04-02: 20 mg via INTRAVENOUS
  Administered 2018-04-02: 40 mg via INTRAVENOUS

## 2018-04-02 MED ORDER — ADULT MULTIVITAMIN W/MINERALS CH
1.0000 | ORAL_TABLET | Freq: Every day | ORAL | Status: DC
  Administered 2018-04-03 – 2018-04-05 (×3): 1 via ORAL
  Filled 2018-04-02 (×2): qty 1

## 2018-04-02 MED ORDER — SODIUM CHLORIDE 0.9 % IV SOLN
INTRAVENOUS | Status: DC | PRN
  Administered 2018-04-02: 30 ug/min via INTRAVENOUS

## 2018-04-02 MED ORDER — METHOCARBAMOL 1000 MG/10ML IJ SOLN
500.0000 mg | Freq: Four times a day (QID) | INTRAVENOUS | Status: DC | PRN
  Filled 2018-04-02: qty 5

## 2018-04-02 MED ORDER — ZOLPIDEM TARTRATE 5 MG PO TABS: 5.0000 mg | ORAL_TABLET | Freq: Every evening | ORAL | Status: DC | PRN

## 2018-04-02 MED ORDER — PHENOL 1.4 % MT LIQD
1.0000 | OROMUCOSAL | Status: DC | PRN
  Filled 2018-04-02: qty 177

## 2018-04-02 MED ORDER — VITAMIN C 500 MG PO TABS
500.0000 mg | ORAL_TABLET | Freq: Every day | ORAL | Status: DC
  Administered 2018-04-03 – 2018-04-05 (×3): 500 mg via ORAL
  Filled 2018-04-02 (×3): qty 1

## 2018-04-02 MED ORDER — ACETAMINOPHEN 325 MG PO TABS: 325.0000 mg | ORAL_TABLET | Freq: Four times a day (QID) | ORAL | Status: DC | PRN

## 2018-04-02 MED ORDER — ONDANSETRON HCL 4 MG PO TABS: 4.0000 mg | ORAL_TABLET | Freq: Four times a day (QID) | ORAL | Status: DC | PRN

## 2018-04-02 MED ORDER — SODIUM CHLORIDE 0.9 % IV SOLN
INTRAVENOUS | Status: DC | PRN
  Administered 2018-04-02: 1000 mL

## 2018-04-02 MED ORDER — CALCIUM CARBONATE ANTACID 500 MG PO CHEW: 1.0000 | CHEWABLE_TABLET | Freq: Every day | ORAL | Status: DC | PRN

## 2018-04-02 MED ORDER — PHENYLEPHRINE HCL 10 MG/ML IJ SOLN
INTRAMUSCULAR | Status: AC
  Filled 2018-04-02: qty 1

## 2018-04-02 MED ORDER — TRAMADOL HCL 50 MG PO TABS
50.0000 mg | ORAL_TABLET | Freq: Four times a day (QID) | ORAL | Status: DC
  Administered 2018-04-02 – 2018-04-05 (×8): 50 mg via ORAL
  Filled 2018-04-02 (×8): qty 1

## 2018-04-02 MED ORDER — ONDANSETRON HCL 4 MG/2ML IJ SOLN
INTRAMUSCULAR | Status: DC | PRN
  Administered 2018-04-02: 4 mg via INTRAVENOUS

## 2018-04-02 MED ORDER — PANTOPRAZOLE SODIUM 40 MG PO TBEC
40.0000 mg | DELAYED_RELEASE_TABLET | Freq: Every day | ORAL | Status: DC
  Administered 2018-04-03 – 2018-04-05 (×3): 40 mg via ORAL
  Filled 2018-04-02 (×3): qty 1

## 2018-04-02 MED ORDER — ALBUMIN HUMAN 5 % IV SOLN
INTRAVENOUS | Status: DC | PRN
  Administered 2018-04-02: 10:00:00 via INTRAVENOUS

## 2018-04-02 MED ORDER — MENTHOL 3 MG MT LOZG
1.0000 | LOZENGE | OROMUCOSAL | Status: DC | PRN
  Filled 2018-04-02 (×2): qty 9

## 2018-04-02 MED ORDER — SODIUM CHLORIDE 0.9 % IV SOLN
INTRAVENOUS | Status: DC
  Administered 2018-04-02 – 2018-04-03 (×2): via INTRAVENOUS

## 2018-04-02 MED ORDER — METOCLOPRAMIDE HCL 10 MG PO TABS: 5.0000 mg | ORAL_TABLET | Freq: Three times a day (TID) | ORAL | Status: DC | PRN

## 2018-04-02 MED ORDER — METHOCARBAMOL 500 MG PO TABS
500.0000 mg | ORAL_TABLET | Freq: Four times a day (QID) | ORAL | Status: DC | PRN
  Administered 2018-04-02 – 2018-04-05 (×11): 500 mg via ORAL
  Filled 2018-04-02 (×11): qty 1

## 2018-04-02 MED ORDER — MAGNESIUM CITRATE PO SOLN
1.0000 | Freq: Once | ORAL | Status: DC | PRN
  Filled 2018-04-02: qty 296

## 2018-04-02 MED ORDER — LACTATED RINGERS IV SOLN
INTRAVENOUS | Status: DC
  Administered 2018-04-02: 08:00:00 via INTRAVENOUS

## 2018-04-02 MED ORDER — ONDANSETRON HCL 4 MG/2ML IJ SOLN: 4.0000 mg | Freq: Four times a day (QID) | INTRAMUSCULAR | Status: DC | PRN

## 2018-04-02 MED ORDER — ONDANSETRON HCL 4 MG/2ML IJ SOLN
INTRAMUSCULAR | Status: AC
  Filled 2018-04-02: qty 2

## 2018-04-02 MED ORDER — DEXAMETHASONE SODIUM PHOSPHATE 10 MG/ML IJ SOLN
INTRAMUSCULAR | Status: AC
  Filled 2018-04-02: qty 1

## 2018-04-02 MED ORDER — HYDROCODONE-ACETAMINOPHEN 7.5-325 MG PO TABS
1.0000 | ORAL_TABLET | ORAL | Status: DC | PRN
  Administered 2018-04-02 – 2018-04-03 (×4): 1 via ORAL
  Filled 2018-04-02 (×4): qty 1

## 2018-04-02 MED ORDER — GLYCOPYRROLATE 0.2 MG/ML IJ SOLN
INTRAMUSCULAR | Status: AC
  Filled 2018-04-02: qty 1

## 2018-04-02 MED ORDER — ALUM & MAG HYDROXIDE-SIMETH 200-200-20 MG/5ML PO SUSP: 30.0000 mL | ORAL | Status: DC | PRN

## 2018-04-02 MED ORDER — PROPOFOL 10 MG/ML IV BOLUS
INTRAVENOUS | Status: DC | PRN
  Administered 2018-04-02: 20 mg via INTRAVENOUS

## 2018-04-02 MED ORDER — TRANEXAMIC ACID-NACL 1000-0.7 MG/100ML-% IV SOLN
1000.0000 mg | INTRAVENOUS | Status: AC
  Filled 2018-04-02: qty 100

## 2018-04-02 MED ORDER — DIPHENHYDRAMINE HCL 12.5 MG/5ML PO ELIX: 12.5000 mg | ORAL_SOLUTION | ORAL | Status: DC | PRN

## 2018-04-02 MED ORDER — ENOXAPARIN SODIUM 40 MG/0.4ML ~~LOC~~ SOLN
40.0000 mg | SUBCUTANEOUS | Status: DC
  Administered 2018-04-03 – 2018-04-05 (×3): 40 mg via SUBCUTANEOUS
  Filled 2018-04-02 (×3): qty 0.4

## 2018-04-02 MED ORDER — METOCLOPRAMIDE HCL 5 MG/ML IJ SOLN: 5.0000 mg | Freq: Three times a day (TID) | INTRAMUSCULAR | Status: DC | PRN

## 2018-04-02 MED ORDER — BISACODYL 5 MG PO TBEC: 5.0000 mg | DELAYED_RELEASE_TABLET | Freq: Every day | ORAL | Status: DC | PRN

## 2018-04-02 MED ORDER — PROPOFOL 500 MG/50ML IV EMUL
INTRAVENOUS | Status: DC | PRN
  Administered 2018-04-02: 100 ug/kg/min via INTRAVENOUS

## 2018-04-02 MED ORDER — DOCUSATE SODIUM 100 MG PO CAPS
100.0000 mg | ORAL_CAPSULE | Freq: Two times a day (BID) | ORAL | Status: DC
  Administered 2018-04-02 – 2018-04-05 (×6): 100 mg via ORAL
  Filled 2018-04-02 (×6): qty 1

## 2018-04-02 MED ORDER — SERTRALINE HCL 50 MG PO TABS
100.0000 mg | ORAL_TABLET | Freq: Every day | ORAL | Status: DC
  Administered 2018-04-03 – 2018-04-05 (×3): 100 mg via ORAL
  Filled 2018-04-02 (×3): qty 2

## 2018-04-02 MED ORDER — HYDROMORPHONE HCL 1 MG/ML IJ SOLN: 0.2500 mg | INTRAMUSCULAR | Status: DC | PRN

## 2018-04-02 MED ORDER — TRANEXAMIC ACID-NACL 1000-0.7 MG/100ML-% IV SOLN
INTRAVENOUS | Status: DC | PRN
  Administered 2018-04-02: 1000 mg via INTRAVENOUS

## 2018-04-02 MED ORDER — EPHEDRINE SULFATE 50 MG/ML IJ SOLN
INTRAMUSCULAR | Status: DC | PRN
  Administered 2018-04-02 (×3): 5 mg via INTRAVENOUS

## 2018-04-02 MED ORDER — HYDROCODONE-ACETAMINOPHEN 5-325 MG PO TABS
1.0000 | ORAL_TABLET | ORAL | Status: DC | PRN
  Administered 2018-04-02 – 2018-04-05 (×11): 1 via ORAL
  Filled 2018-04-02 (×11): qty 1

## 2018-04-02 MED ORDER — ACETAMINOPHEN 500 MG PO TABS
500.0000 mg | ORAL_TABLET | Freq: Four times a day (QID) | ORAL | Status: AC
  Administered 2018-04-02 – 2018-04-03 (×3): 500 mg via ORAL
  Filled 2018-04-02 (×3): qty 1

## 2018-04-02 MED ORDER — ASPIRIN EC 81 MG PO TBEC
81.0000 mg | DELAYED_RELEASE_TABLET | Freq: Every day | ORAL | Status: DC
  Administered 2018-04-03 – 2018-04-05 (×3): 81 mg via ORAL
  Filled 2018-04-02 (×3): qty 1

## 2018-04-02 MED ORDER — FENTANYL CITRATE (PF) 100 MCG/2ML IJ SOLN
INTRAMUSCULAR | Status: AC
  Filled 2018-04-02: qty 2

## 2018-04-02 MED ORDER — PROPOFOL 10 MG/ML IV BOLUS
INTRAVENOUS | Status: AC
  Filled 2018-04-02: qty 20

## 2018-04-02 MED ORDER — BUPIVACAINE-EPINEPHRINE 0.25% -1:200000 IJ SOLN
INTRAMUSCULAR | Status: DC | PRN
  Administered 2018-04-02: 30 mL

## 2018-04-02 MED ORDER — EPHEDRINE SULFATE 50 MG/ML IJ SOLN
INTRAMUSCULAR | Status: AC
  Filled 2018-04-02: qty 1

## 2018-04-02 MED ORDER — DEXAMETHASONE SODIUM PHOSPHATE 10 MG/ML IJ SOLN
INTRAMUSCULAR | Status: DC | PRN
  Administered 2018-04-02: 10 mg via INTRAVENOUS

## 2018-04-02 MED ORDER — MAGNESIUM 200 MG PO TABS
1000.0000 mg | ORAL_TABLET | Freq: Every day | ORAL | Status: DC | PRN
  Filled 2018-04-02: qty 5

## 2018-04-02 MED ORDER — CEFAZOLIN SODIUM-DEXTROSE 2-4 GM/100ML-% IV SOLN
INTRAVENOUS | Status: AC
  Filled 2018-04-02: qty 100

## 2018-04-02 MED ORDER — FENTANYL CITRATE (PF) 100 MCG/2ML IJ SOLN
INTRAMUSCULAR | Status: DC | PRN
  Administered 2018-04-02: 50 ug via INTRAVENOUS
  Administered 2018-04-02: 25 ug via INTRAVENOUS

## 2018-04-02 SURGICAL SUPPLY — 60 items
BLADE SAGITTAL AGGR TOOTH XLG (BLADE) IMPLANT
BLADE SAW 90X13X1.19 OSCILLAT (BLADE) ×2 IMPLANT
BNDG COHESIVE 6X5 TAN STRL LF (GAUZE/BANDAGES/DRESSINGS) ×6 IMPLANT
CANISTER SUCT 1200ML W/VALVE (MISCELLANEOUS) ×2 IMPLANT
CHLORAPREP W/TINT 26ML (MISCELLANEOUS) ×2 IMPLANT
COVER WAND RF STERILE (DRAPES) IMPLANT
DRAPE C-ARM XRAY 36X54 (DRAPES) ×2 IMPLANT
DRAPE INCISE IOBAN 66X60 STRL (DRAPES) IMPLANT
DRAPE POUCH INSTRU U-SHP 10X18 (DRAPES) ×2 IMPLANT
DRAPE SHEET LG 3/4 BI-LAMINATE (DRAPES) ×6 IMPLANT
DRAPE TABLE BACK 80X90 (DRAPES) ×2 IMPLANT
DRESSING SURGICEL FIBRLLR 1X2 (HEMOSTASIS) ×2 IMPLANT
DRSG OPSITE POSTOP 4X8 (GAUZE/BANDAGES/DRESSINGS) ×4 IMPLANT
DRSG SURGICEL FIBRILLAR 1X2 (HEMOSTASIS) ×4
ELECT BLADE 6.5 EXT (BLADE) ×2 IMPLANT
ELECT REM PT RETURN 9FT ADLT (ELECTROSURGICAL) ×2
ELECTRODE REM PT RTRN 9FT ADLT (ELECTROSURGICAL) ×1 IMPLANT
GLOVE BIOGEL PI IND STRL 9 (GLOVE) ×1 IMPLANT
GLOVE BIOGEL PI INDICATOR 9 (GLOVE) ×1
GLOVE SURG SYN 9.0  PF PI (GLOVE) ×2
GLOVE SURG SYN 9.0 PF PI (GLOVE) ×2 IMPLANT
GOWN SRG 2XL LVL 4 RGLN SLV (GOWNS) ×1 IMPLANT
GOWN STRL NON-REIN 2XL LVL4 (GOWNS) ×1
GOWN STRL REUS W/ TWL LRG LVL3 (GOWN DISPOSABLE) ×1 IMPLANT
GOWN STRL REUS W/TWL LRG LVL3 (GOWN DISPOSABLE) ×1
HEAD FEM S 22.2MM -2.5 0125124 (Head) ×2 IMPLANT
HEMOVAC 400CC 10FR (MISCELLANEOUS) IMPLANT
HOLDER FOLEY CATH W/STRAP (MISCELLANEOUS) ×2 IMPLANT
HOOD PEEL AWAY FLYTE STAYCOOL (MISCELLANEOUS) ×2 IMPLANT
KIT PREVENA INCISION MGT 13 (CANNISTER) ×2 IMPLANT
LINER DMC HEAD 0 22.2MM 012622 (Liner) ×2 IMPLANT
MAT ABSORB  FLUID 56X50 GRAY (MISCELLANEOUS) ×1
MAT ABSORB FLUID 56X50 GRAY (MISCELLANEOUS) ×1 IMPLANT
NDL SAFETY ECLIPSE 18X1.5 (NEEDLE) ×1 IMPLANT
NEEDLE HYPO 18GX1.5 SHARP (NEEDLE) ×1
NEEDLE SPNL 18GX3.5 QUINCKE PK (NEEDLE) ×2 IMPLANT
NS IRRIG 1000ML POUR BTL (IV SOLUTION) ×2 IMPLANT
PACK HIP COMPR (MISCELLANEOUS) ×2 IMPLANT
PENCIL SMOKE ULTRAEVAC 22 CON (MISCELLANEOUS) ×2 IMPLANT
SCALPEL PROTECTED #10 DISP (BLADE) ×4 IMPLANT
SHELL ACETABULAR DM  46MM (Shell) ×2 IMPLANT
SOL PREP PVP 2OZ (MISCELLANEOUS) ×2
SOLUTION PREP PVP 2OZ (MISCELLANEOUS) ×1 IMPLANT
SPONGE DRAIN TRACH 4X4 STRL 2S (GAUZE/BANDAGES/DRESSINGS) ×2 IMPLANT
STAPLER SKIN PROX 35W (STAPLE) ×2 IMPLANT
STEM FEMORAL SZ2 LAT COLLARED (Stem) ×2 IMPLANT
STRAP SAFETY 5IN WIDE (MISCELLANEOUS) ×2 IMPLANT
SUT DVC 2 QUILL PDO  T11 36X36 (SUTURE) ×1
SUT DVC 2 QUILL PDO T11 36X36 (SUTURE) ×1 IMPLANT
SUT SILK 0 (SUTURE) ×1
SUT SILK 0 30XBRD TIE 6 (SUTURE) ×1 IMPLANT
SUT V-LOC 90 ABS DVC 3-0 CL (SUTURE) ×2 IMPLANT
SUT VIC AB 1 CT1 36 (SUTURE) ×2 IMPLANT
SYR 20CC LL (SYRINGE) ×2 IMPLANT
SYR 30ML LL (SYRINGE) ×2 IMPLANT
SYR BULB IRRIG 60ML STRL (SYRINGE) ×2 IMPLANT
TAPE MICROFOAM 4IN (TAPE) ×2 IMPLANT
TOWEL OR 17X26 4PK STRL BLUE (TOWEL DISPOSABLE) ×2 IMPLANT
TRAY FOLEY MTR SLVR 16FR STAT (SET/KITS/TRAYS/PACK) ×2 IMPLANT
WND VAC CANISTER 500ML (MISCELLANEOUS) ×2 IMPLANT

## 2018-04-02 NOTE — Anesthesia Procedure Notes (Signed)
Spinal  Patient location during procedure: OR Start time: 04/02/2018 9:30 AM End time: 04/02/2018 9:40 AM Staffing Anesthesiologist: Durenda Hurt, MD Performed: anesthesiologist  Preanesthetic Checklist Completed: patient identified, site marked, surgical consent, pre-op evaluation, timeout performed, IV checked, risks and benefits discussed and monitors and equipment checked Spinal Block Patient position: sitting Prep: ChloraPrep Patient monitoring: heart rate, continuous pulse ox, blood pressure and cardiac monitor Approach: midline Location: L3-4 Injection technique: single-shot Needle Needle type: Whitacre and Introducer  Needle gauge: 24 G Needle length: 9 cm Additional Notes Negative paresthesia. Negative blood return. Positive free-flowing CSF. Expiration date of kit checked and confirmed. Patient tolerated procedure well, without complications.

## 2018-04-02 NOTE — Anesthesia Post-op Follow-up Note (Signed)
Anesthesia QCDR form completed.        

## 2018-04-02 NOTE — NC FL2 (Signed)
[place] LEVEL OF CARE SCREENING TOOL     IDENTIFICATION  Patient Name: Krista Phelps Birthdate: [birth year] Sex: female Admission Date (Current Location): 04/02/2018  [place] and [place] Number:  Engineering geologist and Address:  [facility], [address], [place], [place] [address]      Provider Number: [id number]  Attending Physician Name and Address:  Hessie Knows, MD  Relative Name and Phone Number:       Current Level of Care: Hospital Recommended Level of Care: [place] Prior Approval Number:    Date Approved/Denied:   PASRR Number: [id number])  Discharge Plan: SNF    Current Diagnoses: Patient Active Problem List   Diagnosis Date Noted  . Status post total hip replacement, left 04/02/2018  . Osteoarthritis of left hip 01/16/2018  . Chronic laryngitis 01/16/2018  . Cramps of left lower extremity 01/16/2018  . Depression, recurrent ([place]) 01/16/2018    Orientation RESPIRATION BLADDER Height & Weight     Self, Time, Situation, Place  Normal Continent Weight: 160 lb 1.6 oz (72.6 kg) Height:  5\' 4"  (162.6 cm)  BEHAVIORAL SYMPTOMS/MOOD NEUROLOGICAL BOWEL NUTRITION STATUS      Continent Diet(Diet: Regular )  AMBULATORY STATUS COMMUNICATION OF NEEDS Skin   Extensive Assist Verbally Surgical wounds, Wound Vac(Incision: Left Hip, Provena Wound Vac. )                       Personal Care Assistance Level of Assistance  Bathing, Feeding, Dressing Bathing Assistance: Limited assistance Feeding assistance: Independent Dressing Assistance: Limited assistance     Functional Limitations Info  Sight, Hearing, Speech Sight Info: Adequate Hearing Info: Adequate Speech Info: Adequate    SPECIAL CARE FACTORS FREQUENCY  PT (By licensed PT), OT (By licensed OT)     PT Frequency: (5) OT Frequency: (5)            Contractures      Additional Factors Info  Code Status, Allergies Code Status Info:  (Full Code. ) Allergies Info: (Atropine)           Current Medications (04/02/2018):  This is the current hospital active medication list Current Facility-Administered Medications  Medication Dose Route Frequency Provider Last Rate Last Dose  . 0.9 %  sodium chloride infusion   Intravenous Continuous Hessie Knows, MD 75 mL/hr at 04/02/18 1433    . [START ON 04/03/2018] acetaminophen (TYLENOL) tablet 325 mg  325 mg Oral Q6H PRN Hessie Knows, MD      . acetaminophen (TYLENOL) tablet 500 mg  500 mg Oral Q6H Hessie Knows, MD      . alum & mag hydroxide-simeth (MAALOX/MYLANTA) 200-200-20 MG/5ML suspension 30 mL  30 mL Oral Q4H PRN Hessie Knows, MD      . Derrill Memo ON 04/03/2018] aspirin EC tablet 81 mg  81 mg Oral Daily Hessie Knows, MD      . bisacodyl (DULCOLAX) EC tablet 5 mg  5 mg Oral Daily PRN Hessie Knows, MD      . calcium carbonate (TUMS - dosed in mg elemental calcium) chewable tablet 200 mg of elemental calcium  1 tablet Oral Daily PRN Hessie Knows, MD      . ceFAZolin (ANCEF) IVPB 1 g/50 mL premix  1 g Intravenous Q6H Hessie Knows, MD      . diphenhydrAMINE (BENADRYL) 12.5 MG/5ML elixir 12.5-25 mg  12.5-25 mg Oral Q4H PRN Hessie Knows, MD      . docusate sodium (  COLACE) capsule 100 mg  100 mg Oral BID Hessie Knows, MD      . Derrill Memo ON 04/03/2018] enoxaparin (LOVENOX) injection 40 mg  40 mg Subcutaneous Q24H Hessie Knows, MD      . HYDROcodone-acetaminophen [facility]) 7.5-325 MG per tablet 1 tablet  1 tablet Oral Q4H PRN Hessie Knows, MD      . HYDROcodone-acetaminophen (NORCO/VICODIN) 5-325 MG per tablet 1 tablet  1 tablet Oral Q4H PRN Hessie Knows, MD   1 tablet at 04/02/18 1405  . magnesium citrate solution 1 Bottle  1 Bottle Oral Once PRN Hessie Knows, MD      . magnesium hydroxide (MILK OF MAGNESIA) suspension 30 mL  30 mL Oral Daily PRN Hessie Knows, MD      . Magnesium TABS 1,000 mg  1,000 mg Oral Daily PRN Hessie Knows, MD      . Melatonin TABS 10 mg  10 mg Oral QHS Hessie Knows, MD      . menthol-cetylpyridinium (CEPACOL) lozenge 3 mg  1 lozenge Oral PRN Hessie Knows, MD       Or  . phenol (CHLORASEPTIC) mouth spray 1 spray  1 spray Mouth/Throat PRN Hessie Knows, MD      . methocarbamol (ROBAXIN) tablet 500 mg  500 mg Oral Q6H PRN Hessie Knows, MD       Or  . methocarbamol (ROBAXIN) 500 mg in dextrose 5 % 50 mL IVPB  500 mg Intravenous Q6H PRN Hessie Knows, MD      . metoCLOPramide (REGLAN) tablet 5 mg  5 mg Oral Q8H PRN Hessie Knows, MD       Or  . metoCLOPramide (REGLAN) injection 5 mg  5 mg Intravenous Q8H PRN Hessie Knows, MD      . morphine 2 MG/ML injection 0.5 mg  0.5 mg Intravenous Q2H PRN Hessie Knows, MD      . Derrill Memo ON 04/03/2018] multivitamin with minerals tablet 1 tablet  1 tablet Oral Daily Hessie Knows, MD      . ondansetron [facility]) tablet 4 mg  4 mg Oral Q6H PRN Hessie Knows, MD       Or  . ondansetron [facility]) injection 4 mg  4 mg Intravenous Q6H PRN Hessie Knows, MD      . Derrill Memo ON 04/03/2018] pantoprazole (PROTONIX) EC tablet 40 mg  40 mg Oral Daily Hessie Knows, MD      . Derrill Memo ON 04/03/2018] sertraline (ZOLOFT) tablet 100 mg  100 mg Oral Daily Hessie Knows, MD      . traMADol [facility]) tablet 50 mg  50 mg Oral Q6H Hessie Knows, MD      . tranexamic acid (CYKLOKAPRON) IVPB 1,000 mg  1,000 mg Intravenous To OR Hessie Knows, MD      . Derrill Memo ON 04/03/2018] vitamin C (ASCORBIC ACID) tablet 500 mg  500 mg Oral Daily Hessie Knows, MD      . zolpidem (AMBIEN) tablet 5 mg  5 mg Oral QHS PRN Hessie Knows, MD         Discharge Medications: Please see discharge summary for a list of discharge medications.  Relevant Imaging Results:  Relevant Lab Results:   Additional Information (SSN: [id number])  Maelys Kinnick, Veronia Beets, LCSW

## 2018-04-02 NOTE — Evaluation (Signed)
Physical Therapy Evaluation Patient Details Name: Krista Phelps MRN: [id number] DOB: 1955/04/24 Today's Date: 04/02/2018   History of Present Illness  Pt admitted to for L THR attributed to osteoarthritis. A direct anterior approach was used and pt is WBAT. Pt has a history of osteoarthritis.     Clinical Impression  [name] was admitted for the above and has the following deficits (see PT problem list below). Pt lives in a two story home with her husband which requires negotiating 6 stairs to enter. Pt's husband is able to provide 24/7 assistance for the next three weeks then must return to work. No physical assist was required for bed mobility, cues for sequence were provided. No physical assist for STS, SPT min guard for safety. Pt was able to ambulate 55 ft without any LOB, no physical assist was required, SPT min guard for safety. While ambulating SPT provided cues to increase L knee flexion during swing phase of gait and to maintain upright posture. SPT recommending OPPT upon discharge due to pt's current abilities and available assistance.     Follow Up Recommendations Outpatient PT    Equipment Recommendations  Rolling walker with 5" wheels    Recommendations for Other Services       Precautions / Restrictions Precautions Precautions: Fall Restrictions Weight Bearing Restrictions: Yes LLE Weight Bearing: Weight bearing as tolerated      Mobility  Bed Mobility Overal bed mobility: Needs Assistance Bed Mobility: Supine to Sit     Supine to sit: Min guard;HOB elevated     General bed mobility comments: Pt was able to move to EOB without physical assist. SPT provided VC for sequencing.   Transfers Overall transfer level: Needs assistance Equipment used: Rolling walker (2 wheeled) Transfers: Sit to/from Stand Sit to Stand: Min guard         General transfer comment: Pt was able to STS without physical assist. VC provided by SPT for  handplacement.  Ambulation/Gait Ambulation/Gait assistance: Min guard Gait Distance (Feet): 55 Feet Assistive device: Rolling walker (2 wheeled) Gait Pattern/deviations: Step-to pattern;Decreased step length - right;Decreased step length - left;Decreased stance time - left(decreased L knee flexion in swing phase of gait) Gait velocity: decreased    General Gait Details: Pt was able to ambulate 55 ft without requiring a rest break. Pt given cues to maintain upright posture and perform more L knee flexion during swing phase of gait. Pt denies any chest pain, SOB, or dizziness throughout session.   Stairs            Wheelchair Mobility    Modified Rankin (Stroke Patients Only)       Balance Overall balance assessment: Needs assistance Sitting-balance support: Single extremity supported;Feet supported Sitting balance-Leahy Scale: Poor Sitting balance - Comments: Pt required one UE support to maintain sitting balance. SPT min guard for safety.    Standing balance support: Bilateral upper extremity supported;During functional activity Standing balance-Leahy Scale: Poor Standing balance comment: Pt required BUE support of RW to maintain balance. SPT min guard for safety.                             Pertinent Vitals/Pain Pain Assessment: No/denies pain    Home Living Family/patient expects to be discharged to:: Private residence Living Arrangements: Spouse/significant other Available Help at Discharge: Family;Available 24 hours/day Type of Home: House Home Access: Stairs to enter Entrance Stairs-Rails: Chemical engineer of Steps: 6  Home Layout:  Two level;Able to live on main level with bedroom/bathroom Home Equipment: Hand held shower head;Shower seat Additional Comments: Pt's husband able to provide 24/7 assist for 3 weeks, then has to go back to work.    Prior Function Level of Independence: Needs assistance   Gait / Transfers Assistance  Needed: Pt reports no falls in the past 6 months. 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  ADL's / Homemaking Assistance Needed: Pt was independent with dressing and bathing. Pt required some assistance for cooking and cleaining due to back pain.        Hand Dominance        Extremity/Trunk Assessment   Upper Extremity Assessment Upper Extremity Assessment: Overall WFL for tasks assessed    Lower Extremity Assessment Lower Extremity Assessment: LLE deficits/detail LLE Deficits / Details: Pt able to perform SLR x8, heel slide x8, quad set x10. Unable to formally assess s/p THR.       Communication   Communication: No difficulties  Cognition Arousal/Alertness: Awake/alert Behavior During Therapy: WFL for tasks assessed/performed Overall Cognitive Status: Within Functional Limits for tasks assessed                                        General Comments      Exercises Total Joint Exercises Quad Sets: Both;10 reps;Supine;Strengthening Heel Slides: Strengthening;AROM;Left;Other reps (comment);Supine(8 reps) Straight Leg Raises: Strengthening;Left;Other reps (comment);Supine(8)   Assessment/Plan    PT Assessment Patient needs continued PT services  PT Problem List Decreased strength;Decreased range of motion;Decreased activity tolerance;Decreased balance;Decreased mobility;Decreased knowledge of use of DME;Decreased safety awareness;Pain       PT Treatment Interventions DME instruction;Gait training;Stair training;Functional mobility training;Therapeutic activities;Therapeutic exercise;Balance training;Neuromuscular re-education;Patient/family education;Wheelchair mobility training;Modalities    PT Goals (Current goals can be found in the Care Plan section)  Acute Rehab PT Goals Patient Stated Goal: To return to PLOF PT Goal Formulation: With patient Time For Goal  Achievement: 04/16/18 Potential to Achieve Goals: Good    Frequency BID   Barriers to discharge        Co-evaluation               AM-PAC PT "6 Clicks" Mobility  Outcome Measure Help needed turning from your back to your side while in a flat bed without using bedrails?: A Little Help needed moving from lying on your back to sitting on the side of a flat bed without using bedrails?: A Little Help needed moving to and from a bed to a chair (including a wheelchair)?: A Little Help needed standing up from a chair using your arms (e.g., wheelchair or bedside chair)?: A Little Help needed to walk in hospital room?: A Little Help needed climbing 3-5 steps with a railing? : A Lot 6 Click Score: 17    End of Session Equipment Utilized During Treatment: Gait belt Activity Tolerance: Patient tolerated treatment well Patient left: in chair;with call bell/phone within reach;with chair alarm set;with family/visitor present;with SCD's reapplied;Other (comment)(Chaplain present ) Nurse Communication: Mobility status PT Visit Diagnosis: Unsteadiness on feet (R26.81);Other abnormalities of gait and mobility (R26.89);Muscle weakness (generalized) (M62.81);Pain Pain - Right/Left: Left Pain - part of body: Hip    Time: 9811-9147 PT Time Calculation (min) (ACUTE ONLY): 36 min   Charges:   PT Evaluation $PT Eval Low Complexity:  1 Low PT Treatments $Gait Training: 8-22 mins         Dorothy Spark, SPT  04/02/2018, 4:42 PM

## 2018-04-02 NOTE — H&P (Signed)
Reviewed paper H+P, will be scanned into chart. No changes noted.  

## 2018-04-02 NOTE — Transfer of Care (Signed)
Immediate Anesthesia Transfer of Care Note  Patient: Krista Phelps  Procedure(s) Performed: TOTAL HIP ARTHROPLASTY ANTERIOR APPROACH (Left )  Patient Location: PACU  Anesthesia Type:Spinal and GA combined with regional for post-op pain  Level of Consciousness: awake, alert , oriented and patient cooperative  Airway & Oxygen Therapy: Patient Spontanous Breathing  Post-op Assessment: Report given to RN and Post -op Vital signs reviewed and stable  Post vital signs: Reviewed and stable  Last Vitals:  Vitals Value Taken Time  BP 129/73 04/02/2018 11:06 AM  Temp    Pulse 78 04/02/2018 11:09 AM  Resp 21 04/02/2018 11:09 AM  SpO2 100 % 04/02/2018 11:09 AM  Vitals shown include unvalidated device data.  Last Pain:  Vitals:   04/02/18 0757  TempSrc: Temporal  PainSc: 0-No pain         Complications: No apparent anesthesia complications

## 2018-04-02 NOTE — Op Note (Signed)
04/02/2018  4:12 PM  PATIENT:  Krista Phelps  63 y.o. female  PRE-OPERATIVE DIAGNOSIS:  PRIMARY LOCALIZED OSTEOARTHRITIS OF LEFT HIP  POST-OPERATIVE DIAGNOSIS: Left hip osteoarthritis  PROCEDURE:  Procedure(s): TOTAL HIP ARTHROPLASTY ANTERIOR APPROACH (Left)  SURGEON: Laurene Footman, MD  ASSISTANTS: none  ANESTHESIA:   spinal  EBL:  Total I/O In: 950 [I.V.:700; IV Piggyback:250] Out: 800 [Urine:600; Blood:200]  BLOOD ADMINISTERED:none  DRAINS: none   LOCAL MEDICATIONS USED:  MARCAINE     SPECIMEN:  Source of Specimen:  left femoral head  DISPOSITION OF SPECIMEN:  PATHOLOGY  COUNTS:  YES  TOURNIQUET:  * No tourniquets in log *  IMPLANTS: Medacta 2 lateralized AMIS stem, 46 mm Mpact DM cup and liner, S 22.2 metal head  DICTATION: .Dragon Dictation   The patient was brought to the operating room and after spinal anesthesia was obtained patient was placed on the operative table with the ipsilateral foot into the Medacta attachment, contralateral leg on a well-padded table. C-arm was brought in and preop template x-ray taken. After prepping and draping in usual sterile fashion appropriate patient identification and timeout procedures were completed. Anterior approach to the hip was obtained and centered over the greater trochanter and TFL muscle. The subcutaneous tissue was incised hemostasis being achieved by electrocautery. TFL fascia was incised and the muscle retracted laterally deep retractor placed. The lateral femoral circumflex vessels were identified and ligated. The anterior capsule was exposed and a capsulotomy performed. The neck was identified and a femoral neck cut carried out with a saw. The head was removed without difficulty and showed sclerotic femoral head and acetabulum. Reaming was carried out to 46 mm and a 46 mm cup trial gave appropriate tightness to the acetabular component a 46 DM cup was impacted into position. The leg was then externally rotated and  ischiofemoral and pubofemoral releases carried out. The femur was sequentially broached to a size 2, size 2 lateralized with s head trials were placed and the final components chosen. The 2 lateralized stem was inserted along with a metal 22.2  mm head and 46 mm liner. The hip was reduced and was stable the wound was thoroughly irrigated with fibrillar placed along the posterior capsule and medial neck. The deep fascia ws closed using a heavy Quill after infiltration of 30 cc of quarter percent Sensorcaine with epinephrine.3-0 V-loc to close the skin with skin staples. Incisional wound vac patient was sent to recovery in stable condition.   PLAN OF CARE: Admit to inpatient

## 2018-04-02 NOTE — Anesthesia Preprocedure Evaluation (Addendum)
Anesthesia Evaluation  Patient identified by MRN, date of birth, ID band Patient awake    Reviewed: Allergy & Precautions, H&P , NPO status , Patient's Chart, lab work & pertinent test results  Airway Mallampati: II  TM Distance: >3 FB     Dental  (+) Chipped   Pulmonary former smoker,           Cardiovascular Exercise Tolerance: Good negative cardio ROS       Neuro/Psych PSYCHIATRIC DISORDERS Depression    GI/Hepatic GERD  Controlled,  Endo/Other    Renal/GU   negative genitourinary   Musculoskeletal   Abdominal   Peds  Hematology negative hematology ROS (+)   Anesthesia Other Findings Past Medical History: No date: Depression No date: GERD (gastroesophageal reflux disease) No date: Night sweats No date: Osteoarthritis No date: Reflux No date: Sinus problem  Past Surgical History: No date: APPENDECTOMY No date: CHOLECYSTECTOMY No date: plantar wart removed No date: TONSILLECTOMY AND ADENOIDECTOMY     Reproductive/Obstetrics                            Anesthesia Physical Anesthesia Plan  ASA: II  Anesthesia Plan: Spinal   Post-op Pain Management:    Induction:   PONV Risk Score and Plan: Propofol infusion  Airway Management Planned:   Additional Equipment:   Intra-op Plan:   Post-operative Plan:   Informed Consent: I have reviewed the patients History and Physical, chart, labs and discussed the procedure including the risks, benefits and alternatives for the proposed anesthesia with the patient or authorized representative who has indicated his/her understanding and acceptance.     Dental Advisory Given  Plan Discussed with: Anesthesiologist and CRNA  Anesthesia Plan Comments:         Anesthesia Quick Evaluation

## 2018-04-02 NOTE — Progress Notes (Signed)
Ch visited pt via [place]. Pt and husband were present when ch arrived. Ch joined Emergency planning/management officer with pt and husband to pray about how thankful she is for all that the [facility] has done for her. Ch had pt to practice a breathing exercise and lead prayer. Ch visited was greatly appreciated and ch was thankful for being in their presence. No f/u at this time.    04/02/18 1600  Clinical Encounter Type  Visited With Patient and family together  Visit Type Psychological support;Spiritual support;Social support;Post-op  Referral From Physician  Consult/Referral To Chaplain  Spiritual Encounters  Spiritual Needs Prayer;Ritual;Emotional;Grief support  Stress Factors  Patient Stress Factors Major life changes  Family Stress Factors None identified

## 2018-04-03 LAB — CBC
HCT: 33.2 % — ABNORMAL LOW (ref 36.0–46.0)
Hemoglobin: 10.7 g/dL — ABNORMAL LOW (ref 12.0–15.0)
MCH: 29.8 pg (ref 26.0–34.0)
MCHC: 32.2 g/dL (ref 30.0–36.0)
MCV: 92.5 fL (ref 80.0–100.0)
Platelets: 184 10*3/uL (ref 150–400)
RBC: 3.59 MIL/uL — ABNORMAL LOW (ref 3.87–5.11)
RDW: 13.2 % (ref 11.5–15.5)
WBC: 10.1 10*3/uL (ref 4.0–10.5)
nRBC: 0 % (ref 0.0–0.2)

## 2018-04-03 LAB — BASIC METABOLIC PANEL
Anion gap: 5 (ref 5–15)
BUN: 15 mg/dL (ref 8–23)
CO2: 25 mmol/L (ref 22–32)
Calcium: 8.5 mg/dL — ABNORMAL LOW (ref 8.9–10.3)
Chloride: 108 mmol/L (ref 98–111)
Creatinine, Ser: 0.96 mg/dL (ref 0.44–1.00)
GFR calc Af Amer: >60 mL/min (ref >60)
GFR calc non Af Amer: >60 mL/min (ref >60)
Glucose, Bld: 122 mg/dL — ABNORMAL HIGH (ref 70–99)
Potassium: 3.8 mmol/L (ref 3.5–5.1)
Sodium: 138 mmol/L (ref 135–145)

## 2018-04-03 NOTE — Progress Notes (Signed)
   Subjective: 1 Day Post-Op Procedure(s) (LRB): TOTAL HIP ARTHROPLASTY ANTERIOR APPROACH (Left) Patient reports pain as severe.   Patient is well, and has had no acute complaints or problems Denies any CP, SOB, ABD pain. We will continue therapy today.   Objective: Vital signs in last 24 hours: Temp:  [97.3 F (36.3 C)-98.9 F (37.2 C)] 98.4 F (36.9 C) (02/05 0440) Pulse Rate:  [66-118] 66 (02/05 0440) Resp:  [12-22] 17 (02/05 0440) BP: (85-137)/(44-81) 113/66 (02/05 0440) SpO2:  [94 %-100 %] 96 % (02/05 0440) Weight:  [72.6 kg] 72.6 kg (02/04 1310)  Intake/Output from previous day: 02/04 0701 - 02/05 0700 In: 2370.6 [P.O.:240; I.V.:1730.6; IV Piggyback:400] Out: 2475 [Urine:2275; Blood:200] Intake/Output this shift: No intake/output data recorded.  Recent Labs    04/02/18 1323 04/03/18 0426  HGB 12.6 10.7*   Recent Labs    04/02/18 1323 04/03/18 0426  WBC 7.4 10.1  RBC 4.17 3.59*  HCT 38.2 33.2*  PLT 178 184   Recent Labs    04/02/18 1323 04/03/18 0426  NA  --  138  K  --  3.8  CL  --  108  CO2  --  25  BUN  --  15  CREATININE 0.65 0.96  GLUCOSE  --  122*  CALCIUM  --  8.5*   No results for input(s): LABPT, INR in the last 72 hours.  EXAM General - Patient is Alert, Appropriate and Oriented Extremity - Neurovascular intact Sensation intact distally Intact pulses distally Dorsiflexion/Plantar flexion intact No cellulitis present Compartment soft Dressing - dressing C/D/I and no drainage, provena intact with out drainage Motor Function - intact, moving foot and toes well on exam.   Past Medical History:  Diagnosis Date  . Depression   . GERD (gastroesophageal reflux disease)   . Night sweats   . Osteoarthritis   . Reflux   . Sinus problem     Assessment/Plan:   1 Day Post-Op Procedure(s) (LRB): TOTAL HIP ARTHROPLASTY ANTERIOR APPROACH (Left) Active Problems:   Status post total hip replacement, left  Estimated body mass index is  27.48 kg/m as calculated from the following:   Height as of this encounter: 5\' 4"  (1.626 m).   Weight as of this encounter: 72.6 kg. Advance diet Up with therapy  Needs BM Recheck labs in the am Vital signs stable Labs are stable Pain severe last night but patient encouraged to take pain medications every 4 hours as needed.  We will continue to monitor pain today with current pain regimen. Care management to assist with discharge  DVT Prophylaxis - Lovenox, TED hose and SCDs Weight-Bearing as tolerated to left leg   T. Rachelle Hora, PA-C [place] 04/03/2018, 8:21 AM

## 2018-04-03 NOTE — Care Management Note (Signed)
Case Management Note  Patient Details  Name: Krista Phelps MRN: [id number] Date of Birth: 1955/04/24  Subjective/Objective:                   Met with the patient to discuss DC plan and needs Patient provided with the Krista Phelps list per CMS.gov and has chosen [facility] for [facility] Patient needs RW and 3 in 1, notified [facility] of choice and DME needs Patient lives at home with Husband he will be home to help her for the next 3 weeks . Patient uses Dr. Shea Phelps as PCP and Krista Phelps on [address] is the pharmacy that the patient uses and she can afford her medications with no problem Patient has no other needs at this time   Action/Plan: [facility] list provided per CMS.gov  Expected Discharge Date:                  Expected Discharge Plan:     In-House Referral:     Discharge planning Services     Post Acute Care Choice:  Durable Medical Equipment Choice offered to:  Patient  DME Arranged:  Walker rolling, 3-N-1 DME Agency:  [place]:  PT [place] Agency:  [place]  Status of Service:     If discussed at [place] of Stay Meetings, dates discussed:    Additional Comments:  Su Hilt, RN 04/03/2018, 11:42 AM

## 2018-04-03 NOTE — Anesthesia Postprocedure Evaluation (Signed)
Anesthesia Post Note  Patient: Krista Phelps  Procedure(s) Performed: TOTAL HIP ARTHROPLASTY ANTERIOR APPROACH (Left )  Patient location during evaluation: Nursing Unit Anesthesia Type: Spinal Level of consciousness: awake, awake and alert and oriented Pain management: pain level controlled Vital Signs Assessment: post-procedure vital signs reviewed and stable Respiratory status: spontaneous breathing, nonlabored ventilation and respiratory function stable Cardiovascular status: blood pressure returned to baseline and stable Postop Assessment: no headache and no backache Anesthetic complications: no     Last Vitals:  Vitals:   04/02/18 2254 04/03/18 0440  BP: 123/68 113/66  Pulse: 84 66  Resp: 18 17  Temp: 37.2 C 36.9 C  SpO2: 98% 96%    Last Pain:  Vitals:   04/03/18 0440  TempSrc: Oral  PainSc:                  [name]

## 2018-04-03 NOTE — Progress Notes (Signed)
Physical Therapy Treatment Patient Details Name: Krista Phelps MRN: [id number] DOB: [birth year] Today's Date: 04/03/2018    History of Present Illness Pt admitted to for L THR attributed to osteoarthritis. A direct anterior approach was used and pt is WBAT. Pt has a history of osteoarthritis.     PT Comments    Pt limited this session due to pain, nausea, and dizziness. Pt required Min A to get LLE to EOB, once in sitting at EOB SPT min guard for safety. Prior to STS pt denied any dizziness or SOB, pt performed STS with min guard for safety. Pt ambulated to [facility] in bathroom, upon STS from [facility] pt reported 4/10 dizziness and nausea. Pt became diaphoretic when ambulating back to bed. In sitting at EOB pt reported no dizziness or nausea. Throughout gait SPT provided min guard for safety, pt required multiple VC to keep RW on ground at all times. Current plan remains appropriate.    Follow Up Recommendations  Outpatient PT     Equipment Recommendations  Rolling walker with 5" wheels;3in1 (PT)    Recommendations for Other Services       Precautions / Restrictions Precautions Precautions: Fall;Anterior Hip Precaution Booklet Issued: No Restrictions Weight Bearing Restrictions: Yes LLE Weight Bearing: Weight bearing as tolerated    Mobility  Bed Mobility Overal bed mobility: Needs Assistance Bed Mobility: Supine to Sit;Sit to Supine     Supine to sit: Min assist;HOB elevated Sit to supine: Min assist   General bed mobility comments: deferred 2/2 recent nausea  Transfers Overall transfer level: Needs assistance Equipment used: Rolling walker (2 wheeled) Transfers: Sit to/from Stand Sit to Stand: Min guard         General transfer comment: deferred 2/2 recent nausea  Ambulation/Gait Ambulation/Gait assistance: Min guard Gait Distance (Feet): 18 Feet Assistive device: Rolling walker (2 wheeled) Gait Pattern/deviations: Step-to pattern;Decreased step length - right;Decreased  step length - left;Decreased stance time - left;Decreased weight shift to left(decreased L knee flexion with swing phase of gait) Gait velocity: decreased    General Gait Details: Pt was able to ambulate 18 ft without requiring physical assist. Pt demonstrated intermittent step through pattern but resorted back to step to. Pt required multiple VC to keep RW on ground.   Stairs             Wheelchair Mobility    Modified Rankin (Stroke Patients Only)       Balance Overall balance assessment: Needs assistance Sitting-balance support: Bilateral upper extremity supported;Feet supported Sitting balance-Leahy Scale: Poor Sitting balance - Comments: Pt unable to maintain sitting balance without use of BUE support. SPT min guard for safety.   Standing balance support: Bilateral upper extremity supported;During functional activity Standing balance-Leahy Scale: Poor Standing balance comment: Pt required BUE support of RW to maintain balance. SPT min guard for safety.                            Cognition Arousal/Alertness: Awake/alert Behavior During Therapy: WFL for tasks assessed/performed Overall Cognitive Status: Within Functional Limits for tasks assessed                                        Exercises Total Joint Exercises Ankle Circles/Pumps: AROM;Both;15 reps;Supine Quad Sets: Strengthening;Both;10 reps;Supine Gluteal Sets: Strengthening;Both;10 reps;Supine Other Exercises Other Exercises: pt instructed in falls prevention, pet care considerations,  compression stocking mgt, AE/DME, and self care skills to max safety/independence    General Comments General comments (skin integrity, edema, etc.): Pt reported being in a lot of pain today and requested more assistance in order to manage LLE to EOB. Prior to ambulation pt reported she was not dizzy. Pt ambulated to bathroom and upon STS from [facility] pt stated 4/10 dizziness and nausea.  Pt ambulated  back to bed without assist but was  diaphoretic. In sitting at EOB pt reported no dizziness or nausea. BP 114/69 in supine. RN notified.       Pertinent Vitals/Pain Pain Assessment: 0-10 Pain Score: 4  Pain Location: L hip Pain Descriptors / Indicators: Aching;Grimacing;Guarding Pain Intervention(s): Limited activity within patient's tolerance;Monitored during session;Premedicated before session;Ice applied    Home Living Family/patient expects to be discharged to:: Private residence Living Arrangements: Spouse/significant other Available Help at Discharge: Family;Available 24 hours/day Type of Home: House Home Access: Stairs to enter Entrance Stairs-Rails: Left;Right Home Layout: Two level;Able to live on main level with bedroom/bathroom Home Equipment: Hand held shower head;Shower seat Additional Comments: Pt's husband able to provide 24/7 assist for 3 weeks, then has to go back to work.    Prior Function Level of Independence: Needs assistance  Gait / Transfers Assistance Needed: Pt reports no falls in the past 6 months. 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 ADL's / Homemaking Assistance Needed: Pt was independent with dressing and bathing. Pt required some assistance for cooking and cleaining due to back pain. Pt has 12 cats (some indoor, some outdoor).     PT Goals (current goals can now be found in the care plan section) Acute Rehab PT Goals Patient Stated Goal: To return to PLOF PT Goal Formulation: With patient Time For Goal Achievement: 04/16/18 Potential to Achieve Goals: Good Progress towards PT goals: Not progressing toward goals - comment(progress limited by pain, dizziness, and nausea)    Frequency           PT Plan Current plan remains appropriate;Equipment recommendations need to be updated    Co-evaluation              AM-PAC PT "6 Clicks" Mobility   Outcome  Measure  Help needed turning from your back to your side while in a flat bed without using bedrails?: A Little Help needed moving from lying on your back to sitting on the side of a flat bed without using bedrails?: A Little Help needed moving to and from a bed to a chair (including a wheelchair)?: A Little Help needed standing up from a chair using your arms (e.g., wheelchair or bedside chair)?: A Little Help needed to walk in hospital room?: A Little Help needed climbing 3-5 steps with a railing? : A Lot 6 Click Score: 17    End of Session Equipment Utilized During Treatment: Gait belt Activity Tolerance: Patient limited by pain;Treatment limited secondary to medical complications (Comment)(dizziness and nausea ) Patient left: in bed;with call bell/phone within reach;with bed alarm set;with nursing/sitter in room;with SCD's reapplied Nurse Communication: Mobility status;Other (comment)(dizziness, nausea, BP) PT Visit Diagnosis: Unsteadiness on feet (R26.81);Other abnormalities of gait and mobility (R26.89);Muscle weakness (generalized) (M62.81);Pain Pain - Right/Left: Left Pain - part of body: Hip     Time: 2725-3664 PT Time Calculation (min) (ACUTE ONLY): 29 min  Charges:  $Gait Training: 8-22 mins $Therapeutic Activity: 8-22 mins  Dorothy Spark, SPT  04/03/2018, 11:02 AM

## 2018-04-03 NOTE — Evaluation (Signed)
Occupational Therapy Evaluation Patient Details Name: Krista Phelps MRN: [id number] DOB: [birth year] Today's Date: 04/03/2018    History of Present Illness Pt admitted to for L THR attributed to osteoarthritis. A direct anterior approach was used and pt is WBAT. Pt has a history of osteoarthritis.    Clinical Impression   Pt seen for OT evaluation this date, POD#1 from above surgery. Pt was independent in all ADLs prior to surgery, however occasionally needing some assist from spouse for housekeeping and cooking. Pt lives in a 2 story home with her spouse and 12 cats (indoor and outdoor). Pt is eager to return to PLOF with less pain and improved safety and independence. Pt currently requires minimal assist for LB dressing and bathing while in seated position due to pain and limited AROM of L hip. Pt instructed in self care skills, falls prevention strategies, home/routines modifications, DME/AE for LB bathing and dressing tasks, pet care considerations, and compression stocking mgt strategies. Pt would benefit from additional instruction in self care skills and techniques to help maximize return to PLOF. Do not anticipate skilled OT needs upon discharge.     Follow Up Recommendations  No OT follow up    Equipment Recommendations  None recommended by OT    Recommendations for Other Services       Precautions / Restrictions Precautions Precautions: Fall;Anterior Hip Precaution Booklet Issued: No Restrictions Weight Bearing Restrictions: Yes LLE Weight Bearing: Weight bearing as tolerated      Mobility Bed Mobility     General bed mobility comments: deferred 2/2 recent nausea  Transfers         General transfer comment: deferred 2/2 recent nausea    Balance                           ADL either performed or assessed with clinical judgement   ADL Overall ADL's : Needs assistance/impaired                                       General ADL  Comments: MIN A for LB ADL, MAX A for compression stocking mgt (spouse able to manage)     Vision Baseline Vision/History: Wears glasses Wears Glasses: At all times Patient Visual Report: No change from baseline       Perception     Praxis      Pertinent Vitals/Pain Pain Assessment: 0-10 Pain Score: 4  Pain Location: L hip Pain Descriptors / Indicators: Aching;Grimacing;Guarding Pain Intervention(s): Limited activity within patient's tolerance;Monitored during session;Premedicated before session;Ice applied     Hand Dominance     Extremity/Trunk Assessment Upper Extremity Assessment Upper Extremity Assessment: Overall WFL for tasks assessed   Lower Extremity Assessment Lower Extremity Assessment: (expected post-op strength/ROM deficits in LLE)   Cervical / Trunk Assessment Cervical / Trunk Assessment: Normal   Communication Communication Communication: No difficulties   Cognition Arousal/Alertness: Awake/alert Behavior During Therapy: WFL for tasks assessed/performed Overall Cognitive Status: Within Functional Limits for tasks assessed                                     General Comments      Exercises Other Exercises Other Exercises: pt instructed in falls prevention, pet care considerations, compression stocking mgt, AE/DME, and self care skills to max  safety/independence   Shoulder Instructions      Home Living Family/patient expects to be discharged to:: Private residence Living Arrangements: Spouse/significant other Available Help at Discharge: Family;Available 24 hours/day Type of Home: House Home Access: Stairs to enter [employer] of Steps: 6  Entrance Stairs-Rails: Left;Right Home Layout: Two level;Able to live on main level with bedroom/bathroom Alternate Level Stairs-Number of Steps: flight    Bathroom Shower/Tub: Occupational psychologist: Standard Bathroom Accessibility: Yes   Home Equipment: Hand held  shower head;Shower seat   Additional Comments: Pt's husband able to provide 24/7 assist for 3 weeks, then has to go back to work.      Prior Functioning/Environment Level of Independence: Needs assistance  Gait / Transfers Assistance Needed: Pt reports no falls in the past 6 months. 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 ADL's / Homemaking Assistance Needed: Pt was independent with dressing and bathing. Pt required some assistance for cooking and cleaining due to back pain. Pt has 12 cats (some indoor, some outdoor).            OT Problem List: Decreased strength;Decreased range of motion;Decreased knowledge of use of DME or AE;Pain;Impaired balance (sitting and/or standing)      OT Treatment/Interventions: Self-care/ADL training;Therapeutic exercise;Balance training;Therapeutic activities;DME and/or AE instruction;Patient/family education    OT Goals(Current goals can be found in the care plan section) Acute Rehab OT Goals Patient Stated Goal: To return to PLOF OT Goal Formulation: With patient Time For Goal Achievement: 04/17/18 Potential to Achieve Goals: Good  OT Frequency: Min 1X/week   Barriers to D/C:            Co-evaluation              AM-PAC OT "6 Clicks" Daily Activity     Outcome Measure Help from another person eating meals?: None Help from another person taking care of personal grooming?: None Help from another person toileting, which includes using toliet, bedpan, or urinal?: A Little Help from another person bathing (including washing, rinsing, drying)?: A Little Help from another person to put on and taking off regular upper body clothing?: None Help from another person to put on and taking off regular lower body clothing?: A Little 6 Click Score: 21   End of Session    Activity Tolerance: Patient tolerated treatment well(limited somewhat by recent  nausea/dizziness) Patient left: in bed;with call bell/phone within reach;with bed alarm set;with SCD's reapplied  OT Visit Diagnosis: Other abnormalities of gait and mobility (R26.89);Pain Pain - Right/Left: Left Pain - part of body: Hip                Time: 6195-0932 OT Time Calculation (min): 29 min Charges:  OT General Charges $OT Visit: 1 Visit OT Evaluation $OT Eval Low Complexity: 1 Low OT Treatments $Self Care/Home Management : 23-37 mins  Jeni Salles, MPH, MS, OTR/L ascom [phone] 04/03/18, 10:26 AM

## 2018-04-03 NOTE — Progress Notes (Signed)
Clinical Social Worker (CSW) received SNF consult. PT is recommending outpatient PT. RN case manager aware of above. Please reconsult if future social work needs arise. CSW signing off.   Mckaylie Vasey, LCSW [phone]  

## 2018-04-03 NOTE — Progress Notes (Signed)
Physical Therapy Treatment Patient Details Name: Krista Phelps MRN: [id number] DOB: April 22, 1955 Today's Date: 04/03/2018    History of Present Illness Pt admitted to for L THR attributed to osteoarthritis. A direct anterior approach was used and pt is WBAT. Pt has a history of osteoarthritis.     PT Comments    Krista Phelps progressed towards her mobility goals today, despite reporting 8/10 pain. Pt was able to ambulate 35 ft this session and required Min A of LLE to EOB for sit<>supine, all other aspects of mobility required min guard for safety. In supine pt stated she was 3/10 nausea, sitting at EOB 5/0 nausea, STS 2/10 nausea, and in supine following ambulation 1/10 nausea. After ambulating while sitting at EOB pt stated she was becoming diaphoretic. Pt demonstrated improved management of RW as no cues were provided. Current plan remains appropriate.    Follow Up Recommendations  Outpatient PT     Equipment Recommendations  Rolling walker with 5" wheels;3in1 (PT)    Recommendations for Other Services       Precautions / Restrictions Precautions Precautions: Fall Restrictions Weight Bearing Restrictions: Yes LLE Weight Bearing: Weight bearing as tolerated    Mobility  Bed Mobility Overal bed mobility: Needs Assistance Bed Mobility: Supine to Sit;Sit to Supine     Supine to sit: Min assist;HOB elevated Sit to supine: Min assist   General bed mobility comments: Pt required Min A of LLE to EOB. At EOB pt was able to scoot without physical assist. SPT min guard for sitting at EOB.  Transfers Overall transfer level: Needs assistance Equipment used: Rolling walker (2 wheeled) Transfers: Sit to/from Stand Sit to Stand: Min guard         General transfer comment: Pt did not require physical assist for sit<>stand, SPT min guard for safety.  Ambulation/Gait Ambulation/Gait assistance: Min guard Gait Distance (Feet): 35 Feet Assistive device: Rolling walker (2 wheeled) Gait  Pattern/deviations: Step-to pattern;Decreased step length - right;Decreased step length - left;Decreased stance time - left;Decreased weight shift to left Gait velocity: decreased    General Gait Details: Pt was able to ambulate 35 ft. She did not require any physical assist or VC for hand placement. Pt demonstrated safe management of RW.   Stairs             Wheelchair Mobility    Modified Rankin (Stroke Patients Only)       Balance Overall balance assessment: Needs assistance Sitting-balance support: Bilateral upper extremity supported;Feet supported Sitting balance-Leahy Scale: Poor Sitting balance - Comments: Pt unable to maintain sitting balance without use of BUE support. SPT min guard for safety.   Standing balance support: Bilateral upper extremity supported;During functional activity Standing balance-Leahy Scale: Poor Standing balance comment: Pt required BUE support of RW to maintain balance. SPT min guard for safety.                            Cognition Arousal/Alertness: Awake/alert Behavior During Therapy: WFL for tasks assessed/performed Overall Cognitive Status: Within Functional Limits for tasks assessed                                        Exercises Total Joint Exercises Ankle Circles/Pumps: AROM;Both;10 reps;Supine Quad Sets: Strengthening;Both;10 reps;Supine Gluteal Sets: Strengthening;Both;10 reps;Supine    General Comments General comments (skin integrity, edema, etc.): Pt was able to  move LLE to EOB sit<>supine with Min A, all other aspects of mobility required min guard for safety. In supine pt reported being 3/10 nausea, sitting at EOB 5/10 nausea, STS 2/10 nausea, ambulating 1/10 nausea. Pt believes nausea is due to her pain. Pt requested to be assisted back into bed and stated that she was becoming diaphoretic. In supine pt was not experiencing any nausea. Pt denied any dizziness or SOB throughout session. RN  notified of nausea.      Pertinent Vitals/Pain Pain Assessment: 0-10 Pain Score: 8  Pain Location: L hip Pain Descriptors / Indicators: Grimacing;Guarding;Moaning Pain Intervention(s): Limited activity within patient's tolerance;Monitored during session;Relaxation    Home Living                      Prior Function            PT Goals (current goals can now be found in the care plan section) Acute Rehab PT Goals Patient Stated Goal: To return to PLOF PT Goal Formulation: With patient Time For Goal Achievement: 04/16/18 Potential to Achieve Goals: Good Progress towards PT goals: Progressing toward goals    Frequency    BID      PT Plan Current plan remains appropriate;Equipment recommendations need to be updated    Co-evaluation              AM-PAC PT "6 Clicks" Mobility   Outcome Measure  Help needed turning from your back to your side while in a flat bed without using bedrails?: A Little Help needed moving from lying on your back to sitting on the side of a flat bed without using bedrails?: A Little Help needed moving to and from a bed to a chair (including a wheelchair)?: A Little Help needed standing up from a chair using your arms (e.g., wheelchair or bedside chair)?: A Little Help needed to walk in hospital room?: A Little Help needed climbing 3-5 steps with a railing? : A Lot 6 Click Score: 17    End of Session Equipment Utilized During Treatment: Gait belt Activity Tolerance: Patient tolerated treatment well Patient left: in bed;with call bell/phone within reach;with bed alarm set;with family/visitor present;Other (comment)(MD present) Nurse Communication: Mobility status;Other (comment)(nausea) PT Visit Diagnosis: Unsteadiness on feet (R26.81);Other abnormalities of gait and mobility (R26.89);Muscle weakness (generalized) (M62.81);Pain Pain - Right/Left: Left Pain - part of body: Hip     Time: 6644-0347 PT Time Calculation (min) (ACUTE  ONLY): 32 min  Charges:  $Gait Training: 8-22 mins $Therapeutic Exercise: 8-22 mins                     Krista Phelps, SPT  04/03/2018, 4:03 PM

## 2018-04-04 LAB — BASIC METABOLIC PANEL
ANION GAP: 4 — AB (ref 5–15)
BUN: 9 mg/dL (ref 8–23)
CHLORIDE: 106 mmol/L (ref 98–111)
CO2: 28 mmol/L (ref 22–32)
Calcium: 8.7 mg/dL — ABNORMAL LOW (ref 8.9–10.3)
Creatinine, Ser: 0.77 mg/dL (ref 0.44–1.00)
GFR calc Af Amer: >60 mL/min (ref >60)
GFR calc non Af Amer: >60 mL/min (ref >60)
Glucose, Bld: 108 mg/dL — ABNORMAL HIGH (ref 70–99)
Potassium: 4.1 mmol/L (ref 3.5–5.1)
Sodium: 138 mmol/L (ref 135–145)

## 2018-04-04 LAB — CBC
HCT: 33.6 % — ABNORMAL LOW (ref 36.0–46.0)
Hemoglobin: 10.8 g/dL — ABNORMAL LOW (ref 12.0–15.0)
MCH: 30.2 pg (ref 26.0–34.0)
MCHC: 32.1 g/dL (ref 30.0–36.0)
MCV: 93.9 fL (ref 80.0–100.0)
Platelets: 154 10*3/uL (ref 150–400)
RBC: 3.58 MIL/uL — ABNORMAL LOW (ref 3.87–5.11)
RDW: 13.2 % (ref 11.5–15.5)
WBC: 8.3 10*3/uL (ref 4.0–10.5)
nRBC: 0 % (ref 0.0–0.2)

## 2018-04-04 LAB — SURGICAL PATHOLOGY

## 2018-04-04 MED ORDER — METHOCARBAMOL 500 MG PO TABS: 500.0000 mg | ORAL_TABLET | Freq: Four times a day (QID) | ORAL | 0 refills | Status: DC | PRN

## 2018-04-04 MED ORDER — HYDROCODONE-ACETAMINOPHEN 5-325 MG PO TABS: 1.0000 | ORAL_TABLET | ORAL | 0 refills | Status: DC | PRN

## 2018-04-04 MED ORDER — ENOXAPARIN SODIUM 40 MG/0.4ML ~~LOC~~ SOLN: 40.0000 mg | SUBCUTANEOUS | 0 refills | Status: DC

## 2018-04-04 NOTE — Progress Notes (Signed)
Physical Therapy Treatment Patient Details Name: Krista Phelps MRN: [id number] DOB: 1955/06/23 Today's Date: 04/04/2018    History of Present Illness Pt admitted to for L THR attributed to osteoarthritis. A direct anterior approach was used and pt is WBAT. Pt has a history of osteoarthritis.     PT Comments    Mr. George progressed towards her mobility goals this session as she was able to ambulate a total of 200 ft. Pt was able to get LLE to EOB with the use of a sheet as demonstrated and cued by SPT. Min A was provided to get LLE from sitting at EOB to supine. Pt reported 0/10 dizziness, nausea and pain throghout session. Min guard provided for all other aspects of mobility. Current discharge recommendations remain appropriate.    Follow Up Recommendations  Outpatient PT     Equipment Recommendations  Rolling walker with 5" wheels;3in1 (PT)    Recommendations for Other Services       Precautions / Restrictions Precautions Precautions: Fall Restrictions Weight Bearing Restrictions: Yes LLE Weight Bearing: Weight bearing as tolerated    Mobility  Bed Mobility Overal bed mobility: Needs Assistance Bed Mobility: Supine to Sit;Sit to Supine     Supine to sit: Min guard;HOB elevated Sit to supine: Min assist   General bed mobility comments: Pt was able to assist her LLE to EOB with use of sheet following SPT demonstration and cuing. Min A of LLE was required to get from sitting at EOB to supine. Min guard for safety in sitting.  Transfers Overall transfer level: Needs assistance Equipment used: Rolling walker (2 wheeled) Transfers: Sit to/from Stand Sit to Stand: Min guard         General transfer comment: Pt did not require physical assist for sit<>stand, SPT min guard for safety.  Ambulation/Gait Ambulation/Gait assistance: Min guard Gait Distance (Feet): 200 Feet(20, 180) Assistive device: Rolling walker (2 wheeled) Gait Pattern/deviations: Step-through  pattern;Decreased step length - right;Decreased step length - left;Decreased stance time - left(decreased L knee flex with swing phase ) Gait velocity: decreased    General Gait Details: Pt was able to ambulate a total of 200 ft. SPT min guard for safety. Pt demonstrated decreased L knee flexion with swing phase of gait but was improved upon VC.    Stairs             Wheelchair Mobility    Modified Rankin (Stroke Patients Only)       Balance Overall balance assessment: Needs assistance Sitting-balance support: No upper extremity supported;Feet unsupported Sitting balance-Leahy Scale: Fair Sitting balance - Comments: Pt able to maintain sitting balance without use of BUE support. SPT min guard for safety.   Standing balance support: Bilateral upper extremity supported;During functional activity Standing balance-Leahy Scale: Poor Standing balance comment: Pt required BUE support of RW to maintain balance. SPT min guard for safety.                            Cognition Arousal/Alertness: Awake/alert Behavior During Therapy: WFL for tasks assessed/performed Overall Cognitive Status: Within Functional Limits for tasks assessed                                        Exercises      General Comments General comments (skin integrity, edema, etc.): Pt reports 0/10 dizziness,pain and nausea throughout session.  Pertinent Vitals/Pain Pain Assessment: No/denies pain Pain Score: 0-No pain Pain Intervention(s): Limited activity within patient's tolerance;Monitored during session    Home Living                      Prior Function            PT Goals (current goals can now be found in the care plan section) Acute Rehab PT Goals Patient Stated Goal: To return to PLOF PT Goal Formulation: With patient Time For Goal Achievement: 04/16/18 Potential to Achieve Goals: Good Progress towards PT goals: Progressing toward goals     Frequency    BID      PT Plan Current plan remains appropriate    Co-evaluation              AM-PAC PT "6 Clicks" Mobility   Outcome Measure  Help needed turning from your back to your side while in a flat bed without using bedrails?: A Little Help needed moving from lying on your back to sitting on the side of a flat bed without using bedrails?: A Little Help needed moving to and from a bed to a chair (including a wheelchair)?: A Little Help needed standing up from a chair using your arms (e.g., wheelchair or bedside chair)?: A Little Help needed to walk in hospital room?: A Little Help needed climbing 3-5 steps with a railing? : A Little 6 Click Score: 18    End of Session Equipment Utilized During Treatment: Gait belt Activity Tolerance: Patient tolerated treatment well Patient left: in bed;with call bell/phone within reach;with bed alarm set;with family/visitor present;with SCD's reapplied Nurse Communication: Mobility status PT Visit Diagnosis: Unsteadiness on feet (R26.81);Other abnormalities of gait and mobility (R26.89);Muscle weakness (generalized) (M62.81);Pain Pain - Right/Left: Left Pain - part of body: Hip     Time: 7096-2836 PT Time Calculation (min) (ACUTE ONLY): 31 min  Charges:                       Dorothy Spark, SPT  04/04/2018, 4:05 PM

## 2018-04-04 NOTE — Progress Notes (Signed)
   Subjective: 2 Days Post-Op Procedure(s) (LRB): TOTAL HIP ARTHROPLASTY ANTERIOR APPROACH (Left) Patient reports pain as mild, much improved with Norco and methocarbamol. Patient is well, and has had no acute complaints or problems Denies any CP, SOB, ABD pain. We will continue therapy today.   Objective: Vital signs in last 24 hours: Temp:  [98 F (36.7 C)-98.8 F (37.1 C)] 98.6 F (37 C) (02/06 0912) Pulse Rate:  [73-109] 109 (02/06 0912) Resp:  [18] 18 (02/06 0912) BP: (136-139)/(75-81) 136/81 (02/06 0912) SpO2:  [95 %-96 %] 95 % (02/06 0912)  Intake/Output from previous day: 02/05 0701 - 02/06 0700 In: 683.8 [I.V.:683.8] Out: -  Intake/Output this shift: No intake/output data recorded.  Recent Labs    04/02/18 1323 04/03/18 0426 04/04/18 0428  HGB 12.6 10.7* 10.8*   Recent Labs    04/03/18 0426 04/04/18 0428  WBC 10.1 8.3  RBC 3.59* 3.58*  HCT 33.2* 33.6*  PLT 184 154   Recent Labs    04/03/18 0426 04/04/18 0428  NA 138 138  K 3.8 4.1  CL 108 106  CO2 25 28  BUN 15 9  CREATININE 0.96 0.77  GLUCOSE 122* 108*  CALCIUM 8.5* 8.7*   No results for input(s): LABPT, INR in the last 72 hours.  EXAM General - Patient is Alert, Appropriate and Oriented Extremity - Neurovascular intact Sensation intact distally Intact pulses distally Dorsiflexion/Plantar flexion intact No cellulitis present Compartment soft Dressing - dressing C/D/I and no drainage, provena intact with out drainage Motor Function - intact, moving foot and toes well on exam.   Past Medical History:  Diagnosis Date  . Depression   . GERD (gastroesophageal reflux disease)   . Night sweats   . Osteoarthritis   . Reflux   . Sinus problem     Assessment/Plan:   2 Days Post-Op Procedure(s) (LRB): TOTAL HIP ARTHROPLASTY ANTERIOR APPROACH (Left) Active Problems:   Status post total hip replacement, left  Estimated body mass index is 27.48 kg/m as calculated from the following:  Height as of this encounter: 5\' 4"  (1.626 m).   Weight as of this encounter: 72.6 kg. Advance diet Up with therapy  Vital signs stable Labs are stable Pain much improved.  Good progress with PT yesterday.  Possible discharge to home with home health PT today.  Patient prefers home health PT versus outpatient PT. Care management to assist with discharge to home today if good progress with PT  DVT Prophylaxis - Lovenox, TED hose and SCDs Weight-Bearing as tolerated to left leg   T. Rachelle Hora, PA-C [place] 04/04/2018, 9:16 AM

## 2018-04-04 NOTE — Progress Notes (Signed)
Physical Therapy Treatment Patient Details Name: Krista Phelps MRN: [id number] DOB: [birth year] Today's Date: 04/04/2018    History of Present Illness Pt admitted to for L THR attributed to osteoarthritis. A direct anterior approach was used and pt is WBAT. Pt has a history of osteoarthritis.     PT Comments    Krista Phelps was able to progress towards her mobility goals this session. Pt reported 1/10 nausea and pain in supine prior to OOB activity. Pt was able to initiate RLE towards EOB then required min A for BLE to EOB, once LE at EOB min A for trunk support required to get into sitting. No physical assist was required for any other aspects of mobility, min guard for safety. Pt performed STS, ambulated to the [facility] in the bathroom, was instructed to sit in recliner to be taken to ortho gym. Pt was able to negotiate stairs sideways with VC of SPT. Following stair negotiation pt ambulated back to room without requiring any rest breaks. Pt reports no increase in nausea, no increase in pain, dizziness, or SOB. Current plans remain appropriate upon discharge.    Follow Up Recommendations  Outpatient PT     Equipment Recommendations  Rolling walker with 5" wheels;3in1 (PT)    Recommendations for Other Services       Precautions / Restrictions Precautions Precautions: Fall Restrictions Weight Bearing Restrictions: Yes LLE Weight Bearing: Weight bearing as tolerated    Mobility  Bed Mobility Overal bed mobility: Needs Assistance Bed Mobility: Supine to Sit     Supine to sit: Min assist;HOB elevated     General bed mobility comments: SPT provided Min A for BLE to EOB and Min A for trunk support to get into sitting at EOB. Once at EOB SPT min guard for safety.  Transfers Overall transfer level: Needs assistance Equipment used: Rolling walker (2 wheeled) Transfers: Sit to/from Stand Sit to Stand: Min guard         General transfer comment: Pt did not require physical assist for  sit<>stand, SPT min guard for safety.  Ambulation/Gait Ambulation/Gait assistance: Min guard Gait Distance (Feet): 110 Feet(20, 90) Assistive device: Rolling walker (2 wheeled) Gait Pattern/deviations: Step-through pattern;Decreased step length - right;Decreased step length - left;Decreased stance time - left Gait velocity: decreased    General Gait Details: Pt was able to ambulate a total of 116ft. Pt did not require any rest breaks and denied any SOB, nausea, or dizziness. SPT min guard for safety.   Stairs Stairs: Yes Stairs assistance: Min guard Stair Management: One rail Left;Step to pattern;Sideways Number of Stairs: 4 General stair comments: SPT demonstrated prior to pt attempt. Pt was able to demonstrate safe stair negotiation without any physical assist, SPT min guard for safety.   Wheelchair Mobility    Modified Rankin (Stroke Patients Only)       Balance Overall balance assessment: Needs assistance Sitting-balance support: No upper extremity supported;Feet supported Sitting balance-Leahy Scale: Fair Sitting balance - Comments: Pt able to maintain sitting balance without use of BUE support. SPT min guard for safety.   Standing balance support: Bilateral upper extremity supported;During functional activity Standing balance-Leahy Scale: Poor Standing balance comment: Pt required BUE support of RW to maintain balance. SPT min guard for safety.                            Cognition Arousal/Alertness: Awake/alert Behavior During Therapy: WFL for tasks assessed/performed Overall Cognitive Status: Within Functional  Limits for tasks assessed                                        Exercises      General Comments        Pertinent Vitals/Pain Pain Assessment: 0-10 Pain Score: 1  Pain Location: L hip Pain Descriptors / Indicators: Grimacing;Guarding;Moaning Pain Intervention(s): Limited activity within patient's tolerance;Monitored  during session;Repositioned;Ice applied    Home Living                      Prior Function            PT Goals (current goals can now be found in the care plan section) Acute Rehab PT Goals Patient Stated Goal: To return to PLOF PT Goal Formulation: With patient Time For Goal Achievement: 04/16/18 Potential to Achieve Goals: Good Progress towards PT goals: Progressing toward goals    Frequency    BID      PT Plan Current plan remains appropriate    Co-evaluation              AM-PAC PT "6 Clicks" Mobility   Outcome Measure  Help needed turning from your back to your side while in a flat bed without using bedrails?: A Little Help needed moving from lying on your back to sitting on the side of a flat bed without using bedrails?: A Little Help needed moving to and from a bed to a chair (including a wheelchair)?: A Little Help needed standing up from a chair using your arms (e.g., wheelchair or bedside chair)?: A Little Help needed to walk in hospital room?: A Little Help needed climbing 3-5 steps with a railing? : A Little 6 Click Score: 18    End of Session Equipment Utilized During Treatment: Gait belt Activity Tolerance: Patient tolerated treatment well Patient left: in chair;with call bell/phone within reach;with chair alarm set;Other (comment)(ice pack) Nurse Communication: Mobility status PT Visit Diagnosis: Unsteadiness on feet (R26.81);Other abnormalities of gait and mobility (R26.89);Muscle weakness (generalized) (M62.81);Pain Pain - Right/Left: Left Pain - part of body: Hip     Time: 1610-9604 PT Time Calculation (min) (ACUTE ONLY): 43 min  Charges:  $Gait Training: 23-37 mins $Therapeutic Activity: 8-22 mins             Dorothy Spark, SPT  04/04/2018, 1:13 PM

## 2018-04-04 NOTE — Discharge Summary (Addendum)
Physician Discharge Summary  Patient ID: Krista Phelps MRN: [id number] DOB/AGE: [birth year] 63 y.o.  Admit date: 04/02/2018 Discharge date:04/05/2018 Admission Diagnoses:  PRIMARY LOCALIZED OSTEOARTHRITIS OF LEFT HIP   Discharge Diagnoses: Patient Active Problem List   Diagnosis Date Noted  . Status post total hip replacement, left 04/02/2018  . Osteoarthritis of left hip 01/16/2018  . Chronic laryngitis 01/16/2018  . Cramps of left lower extremity 01/16/2018  . Depression, recurrent ([place]) 01/16/2018    Past Medical History:  Diagnosis Date  . Depression   . GERD (gastroesophageal reflux disease)   . Night sweats   . Osteoarthritis   . Reflux   . Sinus problem      Transfusion: non   Consultants (if any):   Discharged Condition: Improved  Hospital Course: Krista Phelps is an 64 y.o. female who was admitted 04/02/2018 with a diagnosis of left hip osteoarthritis and went to the operating room on 04/02/2018 and underwent the above named procedures.    Surgeries: Procedure(s): TOTAL HIP ARTHROPLASTY ANTERIOR APPROACH on 04/02/2018 Patient tolerated the surgery well. Taken to PACU where she was stabilized and then transferred to the orthopedic floor.  Started on Lovenox 40 mg q 24 hrs. Foot pumps applied bilaterally at 80 mm. Heels elevated on bed with rolled towels. No evidence of DVT. Negative Homan. Physical therapy started on day #1 for gait training and transfer. OT started day #1 for ADL and assisted devices.  Patient's foley was d/c on day #1. Patient's IV was d/c on day #2.  On post op day #3 patient was stable and ready for discharge to home with home health PT.  Implants: Medacta 2 lateralized AMIS stem, 46 mm Mpact DM cup and liner, S 22.2 metal head  She was given perioperative antibiotics:  Anti-infectives (From admission, onward)   Start     Dose/Rate Route Frequency Ordered Stop   04/02/18 1600  ceFAZolin (ANCEF) IVPB 1 g/50 mL premix     1 g 100 mL/hr over  30 Minutes Intravenous Every 6 hours 04/02/18 1245 04/03/18 0409   04/02/18 1003  gentamicin (GARAMYCIN) 80 mg in sodium chloride 0.9 % 500 mL irrigation  Status:  Discontinued       As needed 04/02/18 1012 04/02/18 1055   04/02/18 0750  ceFAZolin (ANCEF) 2-4 GM/100ML-% IVPB    Note to Pharmacy:  Tippett, Amy   : cabinet override      04/02/18 0750 04/02/18 0921   04/01/18 2200  ceFAZolin (ANCEF) IVPB 2g/100 mL premix     2 g 200 mL/hr over 30 Minutes Intravenous  Once 04/01/18 2149 04/02/18 0921    .  She was given sequential compression devices, early ambulation, and Lovenox, teds for DVT prophylaxis.  She benefited maximally from the hospital stay and there were no complications.    Recent vital signs:  Vitals:   04/05/18 0035 04/05/18 0815  BP: (!) 141/89 131/84  Pulse: 82 83  Resp: 16   Temp: 99.3 F (37.4 C) 98 F (36.7 C)  SpO2: 99% 94%    Recent laboratory studies:  Lab Results  Component Value Date   HGB 10.8 (L) 04/04/2018   HGB 10.7 (L) 04/03/2018   HGB 12.6 04/02/2018   Lab Results  Component Value Date   WBC 8.3 04/04/2018   PLT 154 04/04/2018   Lab Results  Component Value Date   INR 0.89 03/20/2018   Lab Results  Component Value Date   NA 138 04/04/2018   K  4.1 04/04/2018   CL 106 04/04/2018   CO2 28 04/04/2018   BUN 9 04/04/2018   CREATININE 0.77 04/04/2018   GLUCOSE 108 (H) 04/04/2018    Discharge Medications:   Allergies as of 04/05/2018      Reactions   Atropine Other (See Comments)   Makes pt hyperactive      Medication List    TAKE these medications   Apple Cider Vinegar 500 MG Tabs Take 2,000 mg by mouth daily as needed (for cramps).   aspirin 81 MG tablet Take 81 mg by mouth daily.   calcium carbonate 500 MG chewable tablet Commonly known as:  TUMS - dosed in mg elemental calcium Chew 1 tablet by mouth daily as needed for indigestion or heartburn.   enoxaparin 40 MG/0.4ML injection Commonly known as:  LOVENOX Inject 0.4  mLs (40 mg total) into the skin daily for 14 days.   HYDROcodone-acetaminophen 5-325 MG tablet Commonly known as:  NORCO/VICODIN Take 1 tablet by mouth every 4 (four) hours as needed for moderate pain (pain score 4-6).   LEG CRAMPS PO Take 4 tablets by mouth daily.   Magnesium 250 MG Tabs Take 1,000 mg by mouth daily as needed (for cramps).   Melatonin 10 MG Tabs Take 10 mg by mouth at bedtime.   methocarbamol 500 MG tablet Commonly known as:  ROBAXIN Take 1 tablet (500 mg total) by mouth every 6 (six) hours as needed for muscle spasms.   multivitamin tablet Take 1 tablet by mouth daily.   pantoprazole 40 MG tablet Commonly known as:  PROTONIX Take 1 tablet (40 mg total) by mouth daily.   sertraline 100 MG tablet Commonly known as:  ZOLOFT Take 1 tablet (100 mg total) by mouth daily.            Durable Medical Equipment  (From admission, onward)         Start     Ordered   04/02/18 1246  DME Walker rolling  Once    Question:  Patient needs a walker to treat with the following condition  Answer:  Status post total hip replacement, left   04/02/18 1245   04/02/18 1246  DME 3 n 1  Once     04/02/18 1245   04/02/18 1246  DME Bedside commode  Once    Question:  Patient needs a bedside commode to treat with the following condition  Answer:  Status post total hip replacement, left   04/02/18 1245          Diagnostic Studies: Dg Hip Operative Unilat W Or W/o Pelvis Left  Result Date: 04/02/2018 CLINICAL DATA:  Left hip replacement EXAM: OPERATIVE LEFT HIP (WITH PELVIS IF PERFORMED) 3 VIEWS TECHNIQUE: Fluoroscopic spot image(s) were submitted for interpretation post-operatively. COMPARISON:  None. FINDINGS: Three intraoperative spot images demonstrate changes of left hip replacement. No visible hardware or bony complicating feature. Normal alignment. IMPRESSION: Left hip replacement.  No visible complicating feature. Electronically Signed   By: Rolm Baptise M.D.   On:  04/02/2018 10:55   Dg Hip Unilat W Or W/o Pelvis 2-3 Views Left  Result Date: 04/02/2018 CLINICAL DATA:  Postoperative pain EXAM: DG HIP (WITH OR WITHOUT PELVIS) 2-3V LEFT COMPARISON:  Portable exam at 1157 hours compared to earlier intraoperative images of 04/02/2018 FINDINGS: LEFT hip prosthesis identified in expected position. No fracture, dislocation, or bone destruction. Skin clips and a wound VAC project over the operative site. IMPRESSION: LEFT hip prosthesis without acute complication. Electronically  Signed   By: Lavonia Dana M.D.   On: 04/02/2018 12:16    Disposition:     Follow-up Information    Duanne Guess, PA-C Follow up in 2 week(s).   Specialties:  Orthopedic Surgery, Emergency Medicine Contact information: [place] [address] [phone]            Signed: Feliberto Gottron 04/05/2018, 8:16 AM

## 2018-04-04 NOTE — Discharge Instructions (Signed)
ANTERIOR APPROACH TOTAL HIP REPLACEMENT POSTOPERATIVE DIRECTIONS   Hip Rehabilitation, Guidelines Following Surgery  The results of a hip operation are greatly improved after range of motion and muscle strengthening exercises. Follow all safety measures which are given to protect your hip. If any of these exercises cause increased pain or swelling in your joint, decrease the amount until you are comfortable again. Then slowly increase the exercises. Call your caregiver if you have problems or questions.   HOME CARE INSTRUCTIONS  Remove items at home which could result in a fall. This includes throw rugs or furniture in walking pathways.   ICE to the affected hip every three hours for 30 minutes at a time and then as needed for pain and swelling.  Continue to use ice on the hip for pain and swelling from surgery. You may notice swelling that will progress down to the foot and ankle.  This is normal after surgery.  Elevate the leg when you are not up walking on it.    Continue to use the breathing machine which will help keep your temperature down.  It is common for your temperature to cycle up and down following surgery, especially at night when you are not up moving around and exerting yourself.  The breathing machine keeps your lungs expanded and your temperature down.  Do not place pillow under knee, focus on keeping the knee straight while resting  DIET You may resume your previous home diet once your are discharged from the hospital.  DRESSING / WOUND CARE / SHOWERING Please remove provena negative pressure dressing on 04/12/18 and apply honey comb dressing. Keep dressing clean and dry at all times.   ACTIVITY Walk with your walker as instructed. Use walker as long as suggested by your caregivers. Avoid periods of inactivity such as sitting longer than an hour when not asleep. This helps prevent blood clots.  You may resume a sexual relationship in one month or when given the OK by  your doctor.  You may return to work once you are cleared by your doctor.  Do not drive a car for 6 weeks or until released by you surgeon.  Do not drive while taking narcotics.  WEIGHT BEARING Weight bearing as tolerated. Use walker/cane as needed for at least 4 weeks post op.  POSTOPERATIVE CONSTIPATION PROTOCOL Constipation - defined medically as fewer than three stools per week and severe constipation as less than one stool per week.  One of the most common issues patients have following surgery is constipation.  Even if you have a regular bowel pattern at home, your normal regimen is likely to be disrupted due to multiple reasons following surgery.  Combination of anesthesia, postoperative narcotics, change in appetite and fluid intake all can affect your bowels.  In order to avoid complications following surgery, here are some recommendations in order to help you during your recovery period.  Colace (docusate) - Pick up an over-the-counter form of Colace or another stool softener and take twice a day as long as you are requiring postoperative pain medications.  Take with a full glass of water daily.  If you experience loose stools or diarrhea, hold the colace until you stool forms back up.  If your symptoms do not get better within 1 week or if they get worse, check with your doctor.  Dulcolax (bisacodyl) - Pick up over-the-counter and take as directed by the product packaging as needed to assist with the movement of your bowels.  Take with a  full glass of water.  Use this product as needed if not relieved by Colace only.  ° °MiraLax (polyethylene glycol) - Pick up over-the-counter to have on hand.  MiraLax is a solution that will increase the amount of water in your bowels to assist with bowel movements.  Take as directed and can mix with a glass of water, juice, soda, coffee, or tea.  Take if you go more than two days without a movement. °Do not use MiraLax more than once per day. Call your  doctor if you are still constipated or irregular after using this medication for 7 days in a row. ° °If you continue to have problems with postoperative constipation, please contact the office for further assistance and recommendations.  If you experience "the worst abdominal pain ever" or develop nausea or vomiting, please contact the office immediatly for further recommendations for treatment. ° °ITCHING ° If you experience itching with your medications, try taking only a single pain pill, or even half a pain pill at a time.  You can also use Benadryl over the counter for itching or also to help with sleep.  ° °TED HOSE STOCKINGS °Wear the elastic stockings on both legs for six weeks following surgery during the day but you may remove then at night for sleeping. ° °MEDICATIONS °See your medication summary on the “After Visit Summary” that the nursing staff will review with you prior to discharge.  You may have some home medications which will be placed on hold until you complete the course of blood thinner medication.  It is important for you to complete the blood thinner medication as prescribed by your surgeon.  Continue your approved medications as instructed at time of discharge. ° °PRECAUTIONS °If you experience chest pain or shortness of breath - call 911 immediately for transfer to the hospital emergency department.  °If you develop a fever greater that 101 F, purulent drainage from wound, increased redness or drainage from wound, foul odor from the wound/dressing, or calf pain - CONTACT YOUR SURGEON.   °                                                °FOLLOW-UP APPOINTMENTS °Make sure you keep all of your appointments after your operation with your surgeon and caregivers. You should call the office at the above phone number and make an appointment for approximately two weeks after the date of your surgery or on the date instructed by your surgeon outlined in the "After Visit Summary". ° °RANGE OF MOTION  AND STRENGTHENING EXERCISES  °These exercises are designed to help you keep full movement of your hip joint. Follow your caregiver's or physical therapist's instructions. Perform all exercises about fifteen times, three times per day or as directed. Exercise both hips, even if you have had only one joint replacement. These exercises can be done on a training (exercise) mat, on the floor, on a table or on a bed. Use whatever works the best and is most comfortable for you. Use music or television while you are exercising so that the exercises are a pleasant break in your day. This will make your life better with the exercises acting as a break in routine you can look forward to.  °Lying on your back, slowly slide your foot toward your buttocks, raising your knee up off the floor. Then   slowly slide your foot back down until your leg is straight again.  °Lying on your back spread your legs as far apart as you can without causing discomfort.  °Lying on your side, raise your upper leg and foot straight up from the floor as far as is comfortable. Slowly lower the leg and repeat.  °Lying on your back, tighten up the muscle in the front of your thigh (quadriceps muscles). You can do this by keeping your leg straight and trying to raise your heel off the floor. This helps strengthen the largest muscle supporting your knee.  °Lying on your back, tighten up the muscles of your buttocks both with the legs straight and with the knee bent at a comfortable angle while keeping your heel on the floor.  ° °IF YOU ARE TRANSFERRED TO A SKILLED REHAB FACILITY °If the patient is transferred to a skilled rehab facility following release from the hospital, a list of the current medications will be sent to the facility for the patient to continue.  When discharged from the skilled rehab facility, please have the facility set up the patient's Home Health Physical Therapy prior to being released. Also, the skilled facility will be responsible  for providing the patient with their medications at time of release from the facility to include their pain medication, the muscle relaxants, and their blood thinner medication. If the patient is still at the rehab facility at time of the two week follow up appointment, the skilled rehab facility will also need to assist the patient in arranging follow up appointment in our office and any transportation needs. ° °MAKE SURE YOU:  °Understand these instructions.  °Get help right away if you are not doing well or get worse.  ° ° °Pick up stool softner and laxative for home use following surgery while on pain medications. °Continue to use ice for pain and swelling after surgery. °Do not use any lotions or creams on the incision until instructed by your surgeon. ° °

## 2018-04-05 NOTE — Care Management (Signed)
Lovenox at [facility] $54.75. Patient agrees and states DME has been delivered by [facility].  I have notified Corene Cornea with [facility] of patient discharge to home today.  No other RNCM needs.

## 2018-04-05 NOTE — Progress Notes (Signed)
Patient is being discharged home with husband. IV removed with cath intact. Previewed Discharge instructions including activity, TEDs, meds, including last dose given, and Prevena placed. Allowed time for questions.

## 2018-04-05 NOTE — Progress Notes (Signed)
Physical Therapy Treatment Patient Details Name: Krista Phelps MRN: [id number] DOB: Feb 16, 1956 Today's Date: 04/05/2018    History of Present Illness Pt admitted to for L THR attributed to osteoarthritis. A direct anterior approach was used and pt is WBAT. Pt has a history of osteoarthritis.     PT Comments    Pt progressed towards mobility goals as demonstrated through therapeutic exercises and ambulation this session. SPT provided min assist for SLR and ABD of LLE. Pt was able to get LLE to EOB with use of sheet without requiring physical assist. SPT provided min guard for all other aspects of mobility. Pt reported 1/10 lightheadedness while ambulating in which she was instructed to take a rest break and stated the lightheadedness dissipated after 30 seconds. Pt denied any SOB or nausea throughout session. Current plan remains appropriate.    Follow Up Recommendations  Outpatient PT     Equipment Recommendations  Rolling walker with 5" wheels;3in1 (PT)    Recommendations for Other Services       Precautions / Restrictions Precautions Precautions: Fall Restrictions Weight Bearing Restrictions: Yes LLE Weight Bearing: Weight bearing as tolerated    Mobility  Bed Mobility Overal bed mobility: Needs Assistance Bed Mobility: Supine to Sit     Supine to sit: Min guard     General bed mobility comments: Pt was able to assist her LLE to EOB with use of sheet. Min guard for safety in sitting.  Transfers Overall transfer level: Needs assistance Equipment used: Rolling walker (2 wheeled) Transfers: Sit to/from Stand Sit to Stand: Min guard         General transfer comment: Pt did not require physical assist for sit<>stand, SPT min guard for safety.  Ambulation/Gait Ambulation/Gait assistance: Min guard Gait Distance (Feet): 100 Feet Assistive device: Rolling walker (2 wheeled) Gait Pattern/deviations: Step-through pattern;Decreased step length - right;Decreased stance  time - left Gait velocity: decreased    General Gait Details: Pt was able to ambulate 100 ft. One rest break was instructed due to pt reporting 1/10 lightheadedness that dissipated with 30 sec rest. Pt denied any SOB, dizziness, or nausea. SPT min guard for safety.    Stairs             Wheelchair Mobility    Modified Rankin (Stroke Patients Only)       Balance Overall balance assessment: Needs assistance Sitting-balance support: No upper extremity supported;Feet unsupported Sitting balance-Leahy Scale: Fair Sitting balance - Comments: Pt able to maintain sitting balance without use of BUE support. SPT min guard for safety.   Standing balance support: Bilateral upper extremity supported;During functional activity Standing balance-Leahy Scale: Poor Standing balance comment: Pt required BUE support of RW to maintain balance. SPT min guard for safety.                            Cognition Arousal/Alertness: Awake/alert Behavior During Therapy: WFL for tasks assessed/performed Overall Cognitive Status: Within Functional Limits for tasks assessed                                        Exercises Total Joint Exercises Ankle Circles/Pumps: AROM;Both;10 reps;Supine Hip ABduction/ADduction: Strengthening;Left;5 reps;Supine;AAROM Straight Leg Raises: AAROM;Strengthening;Left;5 reps;Supine    General Comments General comments (skin integrity, edema, etc.): Pt reports no nausea and no pain increase past 1/10 throughout session.  Pertinent Vitals/Pain Pain Assessment: 0-10 Pain Score: 1  Pain Location: L hip Pain Descriptors / Indicators: Grimacing;Guarding Pain Intervention(s): Limited activity within patient's tolerance;Monitored during session;Premedicated before session;Repositioned;Relaxation    Home Living                      Prior Function            PT Goals (current goals can now be found in the care plan section) Acute  Rehab PT Goals Patient Stated Goal: To return to PLOF PT Goal Formulation: With patient Time For Goal Achievement: 04/16/18 Potential to Achieve Goals: Good Progress towards PT goals: Progressing toward goals    Frequency    BID      PT Plan Current plan remains appropriate    Co-evaluation              AM-PAC PT "6 Clicks" Mobility   Outcome Measure  Help needed turning from your back to your side while in a flat bed without using bedrails?: A Little Help needed moving from lying on your back to sitting on the side of a flat bed without using bedrails?: A Little Help needed moving to and from a bed to a chair (including a wheelchair)?: A Little Help needed standing up from a chair using your arms (e.g., wheelchair or bedside chair)?: A Little Help needed to walk in hospital room?: A Little Help needed climbing 3-5 steps with a railing? : A Little 6 Click Score: 18    End of Session Equipment Utilized During Treatment: Gait belt Activity Tolerance: Patient tolerated treatment well Patient left: in chair;with call bell/phone within reach;with chair alarm set Nurse Communication: Mobility status PT Visit Diagnosis: Unsteadiness on feet (R26.81);Other abnormalities of gait and mobility (R26.89);Muscle weakness (generalized) (M62.81);Pain Pain - Right/Left: Left Pain - part of body: Hip     Time: 4132-4401 PT Time Calculation (min) (ACUTE ONLY): 28 min  Charges:  $Gait Training: 8-22 mins $Therapeutic Exercise: 8-22 mins                     Dorothy Spark, SPT  04/05/2018, 10:53 AM

## 2018-04-05 NOTE — Progress Notes (Signed)
   Subjective: 3 Days Post-Op Procedure(s) (LRB): TOTAL HIP ARTHROPLASTY ANTERIOR APPROACH (Left) Patient reports pain as mild, much improved with Norco and methocarbamol. Patient is well, and has had no acute complaints or problems Denies any CP, SOB, ABD pain. We will continue therapy today.   Objective: Vital signs in last 24 hours: Temp:  [98.6 F (37 C)-99.9 F (37.7 C)] 99.3 F (37.4 C) (02/07 0035) Pulse Rate:  [82-109] 82 (02/07 0035) Resp:  [16-18] 16 (02/07 0035) BP: (136-141)/(78-89) 141/89 (02/07 0035) SpO2:  [95 %-99 %] 99 % (02/07 0035)  Intake/Output from previous day: No intake/output data recorded. Intake/Output this shift: No intake/output data recorded.  Recent Labs    04/02/18 1323 04/03/18 0426 04/04/18 0428  HGB 12.6 10.7* 10.8*   Recent Labs    04/03/18 0426 04/04/18 0428  WBC 10.1 8.3  RBC 3.59* 3.58*  HCT 33.2* 33.6*  PLT 184 154   Recent Labs    04/03/18 0426 04/04/18 0428  NA 138 138  K 3.8 4.1  CL 108 106  CO2 25 28  BUN 15 9  CREATININE 0.96 0.77  GLUCOSE 122* 108*  CALCIUM 8.5* 8.7*   No results for input(s): LABPT, INR in the last 72 hours.  EXAM General - Patient is Alert, Appropriate and Oriented Extremity - Neurovascular intact Sensation intact distally Intact pulses distally Dorsiflexion/Plantar flexion intact No cellulitis present Compartment soft Dressing - dressing C/D/I and no drainage, provena intact with out drainage Motor Function - intact, moving foot and toes well on exam.   Past Medical History:  Diagnosis Date  . Depression   . GERD (gastroesophageal reflux disease)   . Night sweats   . Osteoarthritis   . Reflux   . Sinus problem     Assessment/Plan:   3 Days Post-Op Procedure(s) (LRB): TOTAL HIP ARTHROPLASTY ANTERIOR APPROACH (Left) Active Problems:   Status post total hip replacement, left  Estimated body mass index is 27.48 kg/m as calculated from the following:   Height as of this  encounter: 5\' 4"  (1.626 m).   Weight as of this encounter: 72.6 kg. Advance diet Up with therapy  Vital signs stable Labs are stable Pain much improved.  Good progress with PT yesterday.  Discharge to home with home health PT today.  Patient prefers home health PT versus outpatient PT. Care management to assist with discharge to home today if good progress with PT  DVT Prophylaxis - Lovenox, TED hose and SCDs Weight-Bearing as tolerated to left leg   T. Rachelle Hora, PA-C [place] 04/05/2018, 8:15 AM

## 2018-04-06 DIAGNOSIS — Z96642 Presence of left artificial hip joint: Secondary | ICD-10-CM | POA: Diagnosis not present

## 2018-04-06 DIAGNOSIS — M1991 Primary osteoarthritis, unspecified site: Secondary | ICD-10-CM | POA: Diagnosis not present

## 2018-04-06 DIAGNOSIS — Z471 Aftercare following joint replacement surgery: Secondary | ICD-10-CM | POA: Diagnosis not present

## 2018-04-08 DIAGNOSIS — Z471 Aftercare following joint replacement surgery: Secondary | ICD-10-CM | POA: Diagnosis not present

## 2018-04-08 DIAGNOSIS — M1991 Primary osteoarthritis, unspecified site: Secondary | ICD-10-CM | POA: Diagnosis not present

## 2018-04-08 DIAGNOSIS — Z96642 Presence of left artificial hip joint: Secondary | ICD-10-CM | POA: Diagnosis not present

## 2018-04-10 DIAGNOSIS — Z96642 Presence of left artificial hip joint: Secondary | ICD-10-CM | POA: Diagnosis not present

## 2018-04-10 DIAGNOSIS — Z471 Aftercare following joint replacement surgery: Secondary | ICD-10-CM | POA: Diagnosis not present

## 2018-04-10 DIAGNOSIS — M1991 Primary osteoarthritis, unspecified site: Secondary | ICD-10-CM | POA: Diagnosis not present

## 2018-04-12 DIAGNOSIS — Z471 Aftercare following joint replacement surgery: Secondary | ICD-10-CM | POA: Diagnosis not present

## 2018-04-12 DIAGNOSIS — M1991 Primary osteoarthritis, unspecified site: Secondary | ICD-10-CM | POA: Diagnosis not present

## 2018-04-12 DIAGNOSIS — Z96642 Presence of left artificial hip joint: Secondary | ICD-10-CM | POA: Diagnosis not present

## 2018-04-16 DIAGNOSIS — Z471 Aftercare following joint replacement surgery: Secondary | ICD-10-CM | POA: Diagnosis not present

## 2018-04-16 DIAGNOSIS — Z96642 Presence of left artificial hip joint: Secondary | ICD-10-CM | POA: Diagnosis not present

## 2018-04-16 DIAGNOSIS — M1991 Primary osteoarthritis, unspecified site: Secondary | ICD-10-CM | POA: Diagnosis not present

## 2018-04-18 DIAGNOSIS — Z96642 Presence of left artificial hip joint: Secondary | ICD-10-CM | POA: Diagnosis not present

## 2018-04-18 DIAGNOSIS — Z471 Aftercare following joint replacement surgery: Secondary | ICD-10-CM | POA: Diagnosis not present

## 2018-04-18 DIAGNOSIS — M1991 Primary osteoarthritis, unspecified site: Secondary | ICD-10-CM | POA: Diagnosis not present

## 2018-05-17 DIAGNOSIS — M1612 Unilateral primary osteoarthritis, left hip: Secondary | ICD-10-CM | POA: Diagnosis not present

## 2018-05-21 ENCOUNTER — Encounter: Payer: Self-pay | Admitting: Family Medicine

## 2018-05-21 ENCOUNTER — Other Ambulatory Visit: Payer: Self-pay

## 2018-05-21 ENCOUNTER — Ambulatory Visit (INDEPENDENT_AMBULATORY_CARE_PROVIDER_SITE_OTHER): Payer: Commercial Managed Care - PPO | Admitting: Family Medicine

## 2018-05-21 DIAGNOSIS — K219 Gastro-esophageal reflux disease without esophagitis: Secondary | ICD-10-CM | POA: Insufficient documentation

## 2018-05-21 DIAGNOSIS — M1612 Unilateral primary osteoarthritis, left hip: Secondary | ICD-10-CM

## 2018-05-21 DIAGNOSIS — F339 Major depressive disorder, recurrent, unspecified: Secondary | ICD-10-CM

## 2018-05-21 DIAGNOSIS — J370 Chronic laryngitis: Secondary | ICD-10-CM

## 2018-05-21 MED ORDER — PANTOPRAZOLE SODIUM 40 MG PO TBEC: 40.0000 mg | DELAYED_RELEASE_TABLET | Freq: Every day | ORAL | 3 refills | Status: DC

## 2018-05-21 MED ORDER — MELOXICAM 15 MG PO TABS: 15.0000 mg | ORAL_TABLET | Freq: Every day | ORAL | 3 refills | Status: DC

## 2018-05-21 NOTE — Assessment & Plan Note (Signed)
Wants to continue meloxicam as that helps a great deal discussed not taking one weekend out of the month.

## 2018-05-21 NOTE — Progress Notes (Signed)
   There were no vitals taken for this visit.   Subjective:    Patient ID: Krista Phelps, female    DOB: 02-26-1956, 63 y.o.   MRN: [id number]  HPI: Krista Phelps is a 63 y.o. female  Phone visit  Virtual Visit via Telephone Note Today's visit performed via telephone visit due to COVID-19 isolation precautions I connected with  Alleyne Lac on 05/21/18 by telephone and verified that I am speaking with the correct person using two identifiers.  both at home I discussed the limitations, risks, security and privacy concerns of performing an evaluation and management service by telephone and the availability of in person appointments. I also discussed with the patient that there may be a patient responsible charge related to this service. The patient expressed understanding and agreed to proceed.   Patient follow-up just returned from work and is doing well. Depression stable on Zoloft with no complaints taking medications faithfully without problems. Just had hip replaced and was released by orthopedics.  Reviewed home physical therapy stretching range of motion exercises. Discussed arthritis patient still likes taking meloxicam as that helps with pain and arthritis. Reviewed medication orthopedics had given in the hospital and deleted meds from med list. Reflux stable on Protonix taking faithfully without problems.  Relevant past medical, surgical, family and social history reviewed and updated as indicated. Interim medical history since our last visit reviewed. Allergies and medications reviewed and updated.  Review of Systems  Constitutional: Negative.   Respiratory: Negative.   Cardiovascular: Negative.     Per HPI unless specifically indicated above     Objective:    There were no vitals taken for this visit.  Wt Readings from Last 3 Encounters:  04/02/18 160 lb 1.6 oz (72.6 kg)  03/20/18 160 lb 1.6 oz (72.6 kg)  02/13/18 163 lb 6.4 oz (74.1 kg)    Physical Exam none      Assessment & Plan:   Problem List Items Addressed This Visit      Respiratory   Chronic laryngitis    ENT with normal exam        Digestive   GERD (gastroesophageal reflux disease)    The current medical regimen is effective;  continue present plan and medications.       Relevant Medications   pantoprazole (PROTONIX) 40 MG tablet     Musculoskeletal and Integument   Osteoarthritis of left hip    Wants to continue meloxicam as that helps a great deal discussed not taking one weekend out of the month.      Relevant Medications   meloxicam (MOBIC) 15 MG tablet     Other   Depression, recurrent (HCC)    The current medical regimen is effective;  continue present plan and medications.           Follow up plan: Return in about 6 months (around 11/21/2018) for Physical Exam.  I discussed the assessment and treatment plan with the patient. The patient was provided an opportunity to ask questions and all were answered. The patient agreed with the plan and demonstrated an understanding of the instructions.   The patient was advised to call back or seek an in-person evaluation if the symptoms worsen or if the condition fails to improve as anticipated.   I provided 21+ minutes of non-face-to-face time during this encounter.

## 2018-05-21 NOTE — Assessment & Plan Note (Signed)
The current medical regimen is effective;  continue present plan and medications.  

## 2018-05-21 NOTE — Assessment & Plan Note (Signed)
ENT with normal exam

## 2019-03-17 ENCOUNTER — Other Ambulatory Visit: Payer: Self-pay

## 2019-03-17 ENCOUNTER — Ambulatory Visit (INDEPENDENT_AMBULATORY_CARE_PROVIDER_SITE_OTHER): Payer: Commercial Managed Care - PPO | Admitting: Family Medicine

## 2019-03-17 ENCOUNTER — Encounter: Payer: Self-pay | Admitting: Family Medicine

## 2019-03-17 VITALS — BP 120/75 | HR 114 | Temp 98.4°F | Ht 62.8 in | Wt 173.8 lb

## 2019-03-17 DIAGNOSIS — M1612 Unilateral primary osteoarthritis, left hip: Secondary | ICD-10-CM | POA: Diagnosis not present

## 2019-03-17 DIAGNOSIS — K219 Gastro-esophageal reflux disease without esophagitis: Secondary | ICD-10-CM | POA: Diagnosis not present

## 2019-03-17 DIAGNOSIS — Z1231 Encounter for screening mammogram for malignant neoplasm of breast: Secondary | ICD-10-CM

## 2019-03-17 DIAGNOSIS — F339 Major depressive disorder, recurrent, unspecified: Secondary | ICD-10-CM | POA: Diagnosis not present

## 2019-03-17 DIAGNOSIS — N898 Other specified noninflammatory disorders of vagina: Secondary | ICD-10-CM

## 2019-03-17 MED ORDER — ESTRADIOL 0.1 MG/GM VA CREA: 1.0000 | TOPICAL_CREAM | Freq: Every evening | VAGINAL | 3 refills | Status: DC | PRN

## 2019-03-17 MED ORDER — PANTOPRAZOLE SODIUM 40 MG PO TBEC: DELAYED_RELEASE_TABLET | ORAL | 5 refills | Status: DC

## 2019-03-17 MED ORDER — SERTRALINE HCL 100 MG PO TABS: 100.0000 mg | ORAL_TABLET | Freq: Every day | ORAL | 2 refills | Status: DC

## 2019-03-17 MED ORDER — MELOXICAM 15 MG PO TABS: 15.0000 mg | ORAL_TABLET | Freq: Every day | ORAL | 5 refills | Status: DC

## 2019-03-17 MED ORDER — BUPROPION HCL ER (XL) 150 MG PO TB24: 150.0000 mg | ORAL_TABLET | Freq: Every day | ORAL | 0 refills | Status: DC

## 2019-03-17 NOTE — Progress Notes (Signed)
BP 120/75   Pulse (!) 114   Temp 98.4 F (36.9 C) (Oral)   Ht 5' 2.8" (1.595 m)   Wt 173 lb 12.8 oz (78.8 kg)   SpO2 94%   BMI 30.98 kg/m    Subjective:    Patient ID: Krista Phelps, female    DOB: 04-22-1955, 64 y.o.   MRN: [id number]  HPI: Krista Phelps is a 64 y.o. female  Chief Complaint  Patient presents with  . Depression  . Gastroesophageal Reflux   Patient presenting today for 6 month f/u.   Struggling with moods, feeling some anhedonia, fatigue, lack of motivation. Has been on zoloft for quite some time which used to help quite a bit. Denies SI/HI, severe mood swings, sleep concerns.   GERD - stable on protonix, no breakthrough sxs, melena, N/V.   OA - taking meloxicam prn for pain relief which seems to work well.   Vaginal irritation, spotting and dryness. Painful to have intercourse, which is affecting her relationship and negatively impacting moods. Trying lubricants without much benefit.   Depression screen [facility] 2/9 03/17/2019 02/13/2018  Decreased Interest 3 2  Down, Depressed, Hopeless 1 1  PHQ - 2 Score 4 3  Altered sleeping 3 3  Tired, decreased energy 3 3  Change in appetite 3 3  Feeling bad or failure about yourself  1 0  Trouble concentrating 0 0  Moving slowly or fidgety/restless 0 0  Suicidal thoughts 0 0  PHQ-9 Score 14 12  Difficult doing work/chores Not difficult at all -  No flowsheet data found.  Relevant past medical, surgical, family and social history reviewed and updated as indicated. Interim medical history since our last visit reviewed. Allergies and medications reviewed and updated.  Review of Systems  Per HPI unless specifically indicated above     Objective:    BP 120/75   Pulse (!) 114   Temp 98.4 F (36.9 C) (Oral)   Ht 5' 2.8" (1.595 m)   Wt 173 lb 12.8 oz (78.8 kg)   SpO2 94%   BMI 30.98 kg/m   Wt Readings from Last 3 Encounters:  03/17/19 173 lb 12.8 oz (78.8 kg)  04/02/18 160 lb 1.6 oz (72.6 kg)  03/20/18  160 lb 1.6 oz (72.6 kg)    Physical Exam Vitals and nursing note reviewed.  Constitutional:      Appearance: Normal appearance. She is not ill-appearing.  HENT:     Head: Atraumatic.  Eyes:     Extraocular Movements: Extraocular movements intact.     Conjunctiva/sclera: Conjunctivae normal.  Cardiovascular:     Rate and Rhythm: Normal rate and regular rhythm.     Heart sounds: Normal heart sounds.  Pulmonary:     Effort: Pulmonary effort is normal.     Breath sounds: Normal breath sounds.  Musculoskeletal:        General: Normal range of motion.     Cervical back: Normal range of motion and neck supple.  Skin:    General: Skin is warm and dry.  Neurological:     Mental Status: She is alert and oriented to person, place, and time.  Psychiatric:        Mood and Affect: Mood normal.        Thought Content: Thought content normal.        Judgment: Judgment normal.     Results for orders placed or performed in visit on 123456  Basic metabolic panel  Result Value Ref  Range   Glucose 89 65 - 99 mg/dL   BUN 15 8 - 27 mg/dL   Creatinine, Ser 0.90 0.57 - 1.00 mg/dL   GFR calc non Af Amer 68 >59 mL/min/1.73   GFR calc Af Amer 78 >59 mL/min/1.73   BUN/Creatinine Ratio 17 12 - 28   Sodium 143 134 - 144 mmol/L   Potassium 4.1 3.5 - 5.2 mmol/L   Chloride 103 96 - 106 mmol/L   CO2 27 20 - 29 mmol/L   Calcium 9.9 8.7 - 10.3 mg/dL      Assessment & Plan:   Problem List Items Addressed This Visit      Digestive   GERD (gastroesophageal reflux disease)    Stable and under good control, continue current regimen      Relevant Medications   pantoprazole (PROTONIX) 40 MG tablet     Musculoskeletal and Integument   Osteoarthritis of left hip    Stable on prn meloxicam, continue current regimen      Relevant Medications   meloxicam (MOBIC) 15 MG tablet   Other Relevant Orders   Basic metabolic panel (Completed)     Other   Depression, recurrent ([place]) - Primary    Not  under good control, add wellbutrin to zoloft and continue to monitor closely      Relevant Medications   buPROPion (WELLBUTRIN XL) 150 MG 24 hr tablet   sertraline (ZOLOFT) 100 MG tablet    Other Visit Diagnoses    Encounter for screening mammogram for malignant neoplasm of breast       Relevant Orders   MM 3D SCREEN BREAST BILATERAL   Vaginal irritation       Continue lubricants, start estrace cream       Follow up plan: Return in about 4 weeks (around 04/14/2019) for Mood f/u.

## 2019-03-18 LAB — BASIC METABOLIC PANEL
BUN/Creatinine Ratio: 17 (ref 12–28)
BUN: 15 mg/dL (ref 8–27)
CO2: 27 mmol/L (ref 20–29)
Calcium: 9.9 mg/dL (ref 8.7–10.3)
Chloride: 103 mmol/L (ref 96–106)
Creatinine, Ser: 0.9 mg/dL (ref 0.57–1.00)
GFR calc Af Amer: 78 mL/min/{1.73_m2} (ref >59)
GFR calc non Af Amer: 68 mL/min/{1.73_m2} (ref >59)
Glucose: 89 mg/dL (ref 65–99)
Potassium: 4.1 mmol/L (ref 3.5–5.2)
Sodium: 143 mmol/L (ref 134–144)

## 2019-03-21 NOTE — Assessment & Plan Note (Signed)
Not under good control, add wellbutrin to zoloft and continue to monitor closely

## 2019-03-21 NOTE — Assessment & Plan Note (Signed)
Stable and under good control, continue current regimen 

## 2019-03-21 NOTE — Assessment & Plan Note (Signed)
Stable on prn meloxicam, continue current regimen

## 2019-04-14 ENCOUNTER — Ambulatory Visit: Payer: Commercial Managed Care - PPO | Admitting: Family Medicine

## 2019-04-17 ENCOUNTER — Ambulatory Visit: Payer: Commercial Managed Care - PPO | Admitting: Family Medicine

## 2019-04-24 ENCOUNTER — Encounter: Payer: Self-pay | Admitting: Family Medicine

## 2019-04-24 ENCOUNTER — Other Ambulatory Visit: Payer: Self-pay

## 2019-04-24 ENCOUNTER — Ambulatory Visit: Payer: Commercial Managed Care - PPO | Admitting: Family Medicine

## 2019-04-24 VITALS — BP 134/85 | HR 85 | Temp 99.1°F | Ht 62.8 in | Wt 171.0 lb

## 2019-04-24 DIAGNOSIS — F339 Major depressive disorder, recurrent, unspecified: Secondary | ICD-10-CM | POA: Diagnosis not present

## 2019-04-24 MED ORDER — BUPROPION HCL ER (XL) 300 MG PO TB24: 300.0000 mg | ORAL_TABLET | Freq: Every day | ORAL | 0 refills | Status: DC

## 2019-04-24 NOTE — Progress Notes (Signed)
BP 134/85   Pulse 85   Temp 99.1 F (37.3 C) (Oral)   Ht 5' 2.8" (1.595 m)   Wt 171 lb (77.6 kg)   SpO2 94%   BMI 30.48 kg/m    Subjective:    Patient ID: Krista Phelps, female    DOB: [birth year], 64 y.o.   MRN: [id number]  HPI: Krista Phelps is a 64 y.o. female  Chief Complaint  Patient presents with  . Depression   Patient presenting today for 1 month mood f/u after starting wellbutrin in addition to zoloft. Feeling much better on it, more motivation and happier moods overall. Still feeling a bit lackadaisical overall but much improved. Denies side effects, SI/HI, sleep or appetite issues.   Depression screen [facility] 2/9 04/24/2019 03/17/2019 02/13/2018  Decreased Interest 3 3 2   Down, Depressed, Hopeless 0 1 1  PHQ - 2 Score 3 4 3   Altered sleeping 0 3 3  Tired, decreased energy 3 3 3   Change in appetite 3 3 3   Feeling bad or failure about yourself  1 1 0  Trouble concentrating 0 0 0  Moving slowly or fidgety/restless 0 0 0  Suicidal thoughts 0 0 0  PHQ-9 Score 10 14 12   Difficult doing work/chores Not difficult at all Not difficult at all -     Relevant past medical, surgical, family and social history reviewed and updated as indicated. Interim medical history since our last visit reviewed. Allergies and medications reviewed and updated.  Review of Systems  Per HPI unless specifically indicated above     Objective:    BP 134/85   Pulse 85   Temp 99.1 F (37.3 C) (Oral)   Ht 5' 2.8" (1.595 m)   Wt 171 lb (77.6 kg)   SpO2 94%   BMI 30.48 kg/m   Wt Readings from Last 3 Encounters:  04/24/19 171 lb (77.6 kg)  03/17/19 173 lb 12.8 oz (78.8 kg)  04/02/18 160 lb 1.6 oz (72.6 kg)    Physical Exam Vitals and nursing note reviewed.  Constitutional:      Appearance: Normal appearance. She is not ill-appearing.  HENT:     Head: Atraumatic.  Eyes:     Extraocular Movements: Extraocular movements intact.     Conjunctiva/sclera: Conjunctivae normal.    Cardiovascular:     Rate and Rhythm: Normal rate and regular rhythm.     Heart sounds: Normal heart sounds.  Pulmonary:     Effort: Pulmonary effort is normal.     Breath sounds: Normal breath sounds.  Musculoskeletal:        General: Normal range of motion.     Cervical back: Normal range of motion and neck supple.  Skin:    General: Skin is warm and dry.  Neurological:     Mental Status: She is alert and oriented to person, place, and time.  Psychiatric:        Mood and Affect: Mood normal.        Thought Content: Thought content normal.        Judgment: Judgment normal.     Results for orders placed or performed in visit on 123456  Basic metabolic panel  Result Value Ref Range   Glucose 89 65 - 99 mg/dL   BUN 15 8 - 27 mg/dL   Creatinine, Ser 0.90 0.57 - 1.00 mg/dL   GFR calc non Af Amer 68 >59 mL/min/1.73   GFR calc Af Amer 78 >59 mL/min/1.73  BUN/Creatinine Ratio 17 12 - 28   Sodium 143 134 - 144 mmol/L   Potassium 4.1 3.5 - 5.2 mmol/L   Chloride 103 96 - 106 mmol/L   CO2 27 20 - 29 mmol/L   Calcium 9.9 8.7 - 10.3 mg/dL      Assessment & Plan:   Problem List Items Addressed This Visit      Other   Depression, recurrent ([place]) - Primary    Increase wellbutrin to 300 mg, continue zoloft. Continue to monitor closely      Relevant Medications   buPROPion (WELLBUTRIN XL) 300 MG 24 hr tablet       Follow up plan: Return in about 6 months (around 10/22/2019) for 6 month f/u.

## 2019-04-24 NOTE — Assessment & Plan Note (Signed)
Increase wellbutrin to 300 mg, continue zoloft. Continue to monitor closely

## 2019-04-25 ENCOUNTER — Telehealth: Payer: Self-pay

## 2019-04-25 MED ORDER — FLUTICASONE PROPIONATE 50 MCG/ACT NA SUSP: 2.0000 | Freq: Two times a day (BID) | NASAL | 0 refills | Status: DC

## 2019-04-25 NOTE — Telephone Encounter (Signed)
Krista Phelps, Was Flonse supposed to be sent to the pharmacy?

## 2019-04-25 NOTE — Telephone Encounter (Signed)
Rx sent 

## 2019-04-25 NOTE — Telephone Encounter (Signed)
Copied from [place] [phone]. Topic: General - Other >> Apr 24, 2019  5:04 PM Leward Quan A wrote: Reason for CRM: [facility] called to say that patient was expecting an Rx for Flonase but it was not sent over with her other medication. They are asking if Merrie Roof could please send over an Rx for this medication. Any questions please call the pharmacy

## 2019-05-24 ENCOUNTER — Encounter: Payer: Self-pay | Admitting: Family Medicine

## 2019-05-26 ENCOUNTER — Other Ambulatory Visit: Payer: Self-pay | Admitting: Family Medicine

## 2019-05-26 MED ORDER — SERTRALINE HCL 100 MG PO TABS: 100.0000 mg | ORAL_TABLET | Freq: Every day | ORAL | 1 refills | Status: DC

## 2019-05-26 MED ORDER — BUPROPION HCL ER (XL) 300 MG PO TB24: 300.0000 mg | ORAL_TABLET | Freq: Every day | ORAL | 1 refills | Status: DC

## 2019-06-25 ENCOUNTER — Other Ambulatory Visit: Payer: Self-pay | Admitting: Family Medicine

## 2019-06-25 ENCOUNTER — Encounter: Payer: Self-pay | Admitting: Family Medicine

## 2019-06-25 MED ORDER — BUPROPION HCL ER (XL) 300 MG PO TB24: 300.0000 mg | ORAL_TABLET | Freq: Every day | ORAL | 5 refills | Status: DC

## 2019-06-25 MED ORDER — SERTRALINE HCL 100 MG PO TABS: 100.0000 mg | ORAL_TABLET | Freq: Every day | ORAL | 5 refills | Status: DC

## 2019-07-30 ENCOUNTER — Encounter: Payer: Self-pay | Admitting: Family Medicine

## 2019-09-16 ENCOUNTER — Other Ambulatory Visit: Payer: Self-pay

## 2019-09-16 MED ORDER — PANTOPRAZOLE SODIUM 40 MG PO TBEC: DELAYED_RELEASE_TABLET | ORAL | 5 refills | Status: DC

## 2019-09-16 NOTE — Telephone Encounter (Signed)
Patient last seen 04/24/19 

## 2019-10-02 ENCOUNTER — Other Ambulatory Visit: Payer: Self-pay | Admitting: Family Medicine

## 2019-10-03 ENCOUNTER — Encounter: Payer: Self-pay | Admitting: Family Medicine

## 2019-12-11 ENCOUNTER — Other Ambulatory Visit: Payer: Self-pay | Admitting: Family Medicine

## 2019-12-11 NOTE — Telephone Encounter (Signed)
Previous RL patient. Last seen in February. Routing to admin to schedule follow up and provider for refill.

## 2019-12-16 ENCOUNTER — Other Ambulatory Visit: Payer: Self-pay | Admitting: Family Medicine

## 2020-01-10 ENCOUNTER — Other Ambulatory Visit: Payer: Self-pay | Admitting: Family Medicine

## 2020-01-10 NOTE — Telephone Encounter (Signed)
Requested medication (s) are due for refill today: yes  Requested medication (s) are on the active medication list: yes  Last refill:  12/14/19  Future visit scheduled: no  Notes to clinic:  30 day courtesy refill already given - called pt and LM on VM to call office to schedule appt   Requested Prescriptions  Pending Prescriptions Disp Refills   buPROPion (WELLBUTRIN XL) 300 MG 24 hr tablet [Pharmacy Med Name: buPROPion HCl ER (XL) 300 MG Oral Tablet Extended Release 24 Hour] 30 tablet 0    Sig: Take 1 tablet by mouth once daily      Psychiatry: Antidepressants - bupropion Failed - 01/10/2020 10:19 AM      Failed - Valid encounter within last 6 months    Recent Outpatient Visits           8 months ago Depression, recurrent [facility])   [facility] Vol[place] American, [place]   9 months ago Depression, recurrent [facility])   [facility] [place], [place]   1 year ago Chronic laryngitis   [facility] [place], Jeannette How, MD   1 year ago Chronic laryngitis   [facility] Crissman, Jeannette How, MD   1 year ago Primary osteoarthritis of left hip   [place], [place]              Passed - Completed PHQ-2 or PHQ-9 in the last 360 days      Passed - Last BP in normal range    BP Readings from Last 1 Encounters:  04/24/19 134/85

## 2020-01-10 NOTE — Telephone Encounter (Signed)
Requested medication (s) are due for refill today: yes  Requested medication (s) are on the active medication list: yes  Last refill:  06/25/19  Future visit scheduled: no  Notes to clinic:  Needs cosigner- pt of Merrie Roof PA-C   Requested Prescriptions  Pending Prescriptions Disp Refills   sertraline (ZOLOFT) 100 MG tablet [Pharmacy Med Name: Sertraline HCl 100 MG Oral Tablet] 30 tablet 0    Sig: Take 1 tablet by mouth once daily      Psychiatry:  Antidepressants - SSRI Failed - 01/10/2020 10:19 AM      Failed - Valid encounter within last 6 months    Recent Outpatient Visits           8 months ago Depression, recurrent [facility])   [facility], [place]   9 months ago Depression, recurrent [facility])   [facility], [place]   1 year ago Chronic laryngitis   [facility] Crissman, Jeannette How, MD   1 year ago Chronic laryngitis   [facility] Crissman, Jeannette How, MD   1 year ago Primary osteoarthritis of left hip   [place], MD              Passed - Completed PHQ-2 or PHQ-9 in the last 360 days

## 2020-01-12 ENCOUNTER — Other Ambulatory Visit: Payer: Self-pay | Admitting: Family Medicine

## 2020-01-12 MED ORDER — SERTRALINE HCL 100 MG PO TABS: 100.0000 mg | ORAL_TABLET | Freq: Every day | ORAL | 0 refills | Status: DC

## 2020-01-12 MED ORDER — BUPROPION HCL ER (XL) 300 MG PO TB24: 300.0000 mg | ORAL_TABLET | Freq: Every day | ORAL | 0 refills | Status: DC

## 2020-01-12 NOTE — Telephone Encounter (Signed)
Medication: buPROPion (WELLBUTRIN XL) 300 MG 24 hr tablet [[id number]] , sertraline (ZOLOFT) 100 MG tablet [[id number]]  Apt 01/14/20  Has the patient contacted their pharmacy? YES  (Agent: If no, request that the patient contact the pharmacy for the refill.) (Agent: If yes, when and what did the pharmacy advise?)  Preferred Pharmacy (with phone number or street name): [facility] ([facility] [place] - [address] [place] ([place]) [place] [address] Phone: [phone] Fax: [phone] Hours: Not open 24 hours    Agent: Please be advised that RX refills may take up to 3 business days. We ask that you follow-up with your pharmacy.

## 2020-01-12 NOTE — Telephone Encounter (Signed)
Courtesy refill . appt scheduled in 2 days

## 2020-01-14 ENCOUNTER — Other Ambulatory Visit: Payer: Self-pay

## 2020-01-14 ENCOUNTER — Encounter: Payer: Self-pay | Admitting: Family Medicine

## 2020-01-14 ENCOUNTER — Ambulatory Visit (INDEPENDENT_AMBULATORY_CARE_PROVIDER_SITE_OTHER): Payer: Commercial Managed Care - PPO | Admitting: Family Medicine

## 2020-01-14 VITALS — BP 119/80 | HR 79 | Temp 99.1°F | Ht 63.0 in | Wt 147.0 lb

## 2020-01-14 DIAGNOSIS — M65342 Trigger finger, left ring finger: Secondary | ICD-10-CM | POA: Diagnosis not present

## 2020-01-14 DIAGNOSIS — F339 Major depressive disorder, recurrent, unspecified: Secondary | ICD-10-CM | POA: Diagnosis not present

## 2020-01-14 DIAGNOSIS — N952 Postmenopausal atrophic vaginitis: Secondary | ICD-10-CM

## 2020-01-14 DIAGNOSIS — M25511 Pain in right shoulder: Secondary | ICD-10-CM

## 2020-01-14 MED ORDER — PANTOPRAZOLE SODIUM 40 MG PO TBEC
DELAYED_RELEASE_TABLET | ORAL | 5 refills | Status: DC
Start: 2020-01-14 — End: 2020-11-15

## 2020-01-14 MED ORDER — SERTRALINE HCL 100 MG PO TABS
150.0000 mg | ORAL_TABLET | Freq: Every day | ORAL | 3 refills | Status: DC
Start: 2020-01-14 — End: 2020-06-23

## 2020-01-14 MED ORDER — BUPROPION HCL ER (XL) 300 MG PO TB24
300.0000 mg | ORAL_TABLET | Freq: Every day | ORAL | 1 refills | Status: DC
Start: 2020-01-14 — End: 2020-08-25

## 2020-01-14 MED ORDER — ESTRADIOL 0.1 MG/GM VA CREA
1.0000 | TOPICAL_CREAM | Freq: Every evening | VAGINAL | 3 refills | Status: DC | PRN
Start: 2020-01-14 — End: 2022-06-02

## 2020-01-14 MED ORDER — MELOXICAM 15 MG PO TABS
15.0000 mg | ORAL_TABLET | Freq: Every day | ORAL | 1 refills | Status: DC
Start: 2020-01-14 — End: 2020-08-25

## 2020-01-14 NOTE — Progress Notes (Signed)
BP 119/80   Pulse 79   Temp 99.1 F (37.3 C) (Oral)   Ht 5\' 3"  (1.6 m)   Wt 147 lb (66.7 kg)   SpO2 97%   BMI 26.04 kg/m    Subjective:    Patient ID: Krista Phelps, female    DOB: [birth year], 64 y.o.   MRN: [id number]  HPI: Krista Phelps is a 64 y.o. female  Chief Complaint  Patient presents with  . Right shoulder pain    painful for 3 weeks  . Left ring finger    trigger finger, base of finger on palm side is painful  . Medicaiton Refills   DEPRESSION Mood status: better- not where she needs to be Satisfied with current treatment?: yes Symptom severity: moderate  Duration of current treatment : chronic Side effects: no Medication compliance: excellent compliance Psychotherapy/counseling: no  Previous psychiatric medications: sertraline, wellbutrin Depressed mood: yes Anxious mood: yes Anhedonia: yes Significant weight loss or gain: no Insomnia: no  Fatigue: yes Feelings of worthlessness or guilt: no Impaired concentration/indecisiveness: no Suicidal ideations: no Hopelessness: no Crying spells: yes Depression screen [facility] 2/9 01/14/2020 04/24/2019 03/17/2019 02/13/2018  Decreased Interest 3 3 3 2   Down, Depressed, Hopeless 2 0 1 1  PHQ - 2 Score 5 3 4 3   Altered sleeping 3 0 3 3  Tired, decreased energy 3 3 3 3   Change in appetite 1 3 3 3   Feeling bad or failure about yourself  0 1 1 0  Trouble concentrating 0 0 0 0  Moving slowly or fidgety/restless 0 0 0 0  Suicidal thoughts 0 0 0 0  PHQ-9 Score 12 10 14 12   Difficult doing work/chores - Not difficult at all Not difficult at all -   L ring finger triggers. Not particularly painful.   SHOULDER PAIN Duration: couple of months Involved shoulder: right Mechanism of injury: unknown Location: lateral Onset:gradual Severity: mild, more moderate with movement Quality:  Aching and sore and tight Frequency: intermittent Radiation: no Aggravating factors: lifting and movement  Alleviating  factors:meloxicam   Status: better Treatments attempted: aspercream, rest, ice, heat, sling, APAP, ibuprofen and aleve  Relief with NSAIDs?:  mild Weakness: yes Numbness: no Decreased grip strength: no Redness: no Swelling: no Bruising: no Fevers: no   Relevant past medical, surgical, family and social history reviewed and updated as indicated. Interim medical history since our last visit reviewed. Allergies and medications reviewed and updated.  Review of Systems  Constitutional: Negative.   Respiratory: Negative.   Cardiovascular: Negative.   Musculoskeletal: Positive for arthralgias. Negative for back pain, gait problem, joint swelling, myalgias, neck pain and neck stiffness.  Skin: Negative.   Neurological: Negative.   Psychiatric/Behavioral: Negative.     Per HPI unless specifically indicated above     Objective:    BP 119/80   Pulse 79   Temp 99.1 F (37.3 C) (Oral)   Ht 5\' 3"  (1.6 m)   Wt 147 lb (66.7 kg)   SpO2 97%   BMI 26.04 kg/m   Wt Readings from Last 3 Encounters:  01/14/20 147 lb (66.7 kg)  04/24/19 171 lb (77.6 kg)  03/17/19 173 lb 12.8 oz (78.8 kg)    Physical Exam Vitals and nursing note reviewed.  Constitutional:      General: She is not in acute distress.    Appearance: Normal appearance. She is not ill-appearing, toxic-appearing or diaphoretic.  HENT:     Head: Normocephalic and atraumatic.  Right Ear: External ear normal.     Left Ear: External ear normal.     Nose: Nose normal.     Mouth/Throat:     Mouth: Mucous membranes are moist.     Pharynx: Oropharynx is clear.  Eyes:     General: No scleral icterus.       Right eye: No discharge.        Left eye: No discharge.     Extraocular Movements: Extraocular movements intact.     Conjunctiva/sclera: Conjunctivae normal.     Pupils: Pupils are equal, round, and reactive to light.  Cardiovascular:     Rate and Rhythm: Normal rate and regular rhythm.     Pulses: Normal pulses.      Heart sounds: Normal heart sounds. No murmur heard.  No friction rub. No gallop.   Pulmonary:     Effort: Pulmonary effort is normal. No respiratory distress.     Breath sounds: Normal breath sounds. No stridor. No wheezing, rhonchi or rales.  Chest:     Chest wall: No tenderness.  Musculoskeletal:        General: Normal range of motion.     Cervical back: Normal range of motion and neck supple.  Skin:    General: Skin is warm and dry.     Capillary Refill: Capillary refill takes less than 2 seconds.     Coloration: Skin is not jaundiced or pale.     Findings: No bruising, erythema, lesion or rash.  Neurological:     General: No focal deficit present.     Mental Status: She is alert and oriented to person, place, and time. Mental status is at baseline.  Psychiatric:        Mood and Affect: Mood normal.        Behavior: Behavior normal.        Thought Content: Thought content normal.        Judgment: Judgment normal.     Results for orders placed or performed in visit on 53/29/92  Basic metabolic panel  Result Value Ref Range   Glucose 89 65 - 99 mg/dL   BUN 15 8 - 27 mg/dL   Creatinine, Ser 0.90 0.57 - 1.00 mg/dL   GFR calc non Af Amer 68 >59 mL/min/1.73   GFR calc Af Amer 78 >59 mL/min/1.73   BUN/Creatinine Ratio 17 12 - 28   Sodium 143 134 - 144 mmol/L   Potassium 4.1 3.5 - 5.2 mmol/L   Chloride 103 96 - 106 mmol/L   CO2 27 20 - 29 mmol/L   Calcium 9.9 8.7 - 10.3 mg/dL      Assessment & Plan:   Problem List Items Addressed This Visit      Other   Depression, recurrent ([place]) - Primary    Still not under great control. Will increase her sertraline to 150mg  and continue wellbutrin. Call with any concerns. Continue to monitor.       Relevant Medications   sertraline (ZOLOFT) 100 MG tablet   buPROPion (WELLBUTRIN XL) 300 MG 24 hr tablet    Other Visit Diagnoses    Trigger finger, left ring finger       Not bothering her significantly- if it starts getting worse,  will get into hand surgeon. Call with any concerns.    Vaginal atrophy       Will start using some of the estradiol on the labia. If not helping with tightness and driness, consider prempro. Continue to monitor.  Acute pain of right shoulder       Will start exercises and monitor. If not getting better, will get her into PT. Call with any concerns.        Follow up plan: Return in about 3 months (around 04/15/2020) for Physical.

## 2020-01-14 NOTE — Patient Instructions (Addendum)
Trigger Finger  Trigger finger, also called stenosing tenosynovitis,  is a condition that causes a finger to get stuck in a bent position. Each finger has a tendon, which is a tough, cord-like tissue that connects muscle to bone, and each tendon passes through a tunnel of tissue called a tendon sheath. To move your finger, your tendon needs to glide freely through the sheath. Trigger finger happens when the tendon or the sheath thickens, making it difficult to move your finger. Trigger finger can affect any finger or a thumb. It may affect more than one finger. Mild cases may clear up with rest and medicine. Severe cases require more treatment. What are the causes? Trigger finger is caused by a thickened finger tendon or tendon sheath. The cause of this thickening is not known. What increases the risk? The following factors may make you more likely to develop this condition:  Doing activities that require a strong grip.  Having rheumatoid arthritis, gout, or diabetes.  Being 90-71 years old.  Being female. What are the signs or symptoms? Symptoms of this condition include:  Pain when bending or straightening your finger.  Tenderness or swelling where your finger attaches to the palm of your hand.  A lump in the palm of your hand or on the inside of your finger.  Hearing a noise like a pop or a snap when you try to straighten your finger.  Feeling a catching or locking sensation when you try to straighten your finger.  Being unable to straighten your finger. How is this diagnosed? This condition is diagnosed based on your symptoms and a physical exam. How is this treated? This condition may be treated by:  Resting your finger and avoiding activities that make symptoms worse.  Wearing a finger splint to keep your finger extended.  Taking NSAIDs, such as ibuprofen, to relieve pain and swelling.  Doing gentle exercises to stretch the finger as told by your health care  provider.  Having medicine that reduces swelling and inflammation (steroids) injected into the tendon sheath. Injections may need to be repeated.  Having surgery to open the tendon sheath. This may be done if other treatments do not work and you cannot straighten your finger. You may need physical therapy after surgery. Follow these instructions at home: If you have a splint:  Wear the splint as told by your health care provider. Remove it only as told by your health care provider.  Loosen it if your fingers tingle, become numb, or turn cold and blue.  Keep it clean.  If the splint is not waterproof: ? Do not let it get wet. ? Cover it with a watertight covering when you take a bath or shower. Managing pain, stiffness, and swelling     If directed, apply heat to the affected area as often as told by your health care provider. Use the heat source that your health care provider recommends, such as a moist heat pack or a heating pad.  Place a towel between your skin and the heat source.  Leave the heat on for 20-30 minutes.  Remove the heat if your skin turns bright red. This is especially important if you are unable to feel pain, heat, or cold. You may have a greater risk of getting burned. If directed, put ice on the painful area. To do this:  If you have a removable splint, remove it as told by your health care provider.  Put ice in a plastic bag.  Place a  towel between your skin and the bag or between your splint and the bag.  Leave the ice on for 20 minutes, 2-3 times a day.  Activity  Rest your finger as told by your health care provider. Avoid activities that make the pain worse.  Return to your normal activities as told by your health care provider. Ask your health care provider what activities are safe for you.  Do exercises as told by your health care provider.  Ask your health care provider when it is safe to drive if you have a splint on your hand. General  instructions  Take over-the-counter and prescription medicines only as told by your health care provider.  Keep all follow-up visits as told by your health care provider. This is important. Contact a health care provider if:  Your symptoms are not improving with home care. Summary  Trigger finger, also called stenosing tenosynovitis, causes your finger to get stuck in a bent position. This can make it difficult and painful to straighten your finger.  This condition develops when a finger tendon or tendon sheath thickens.  Treatment may include resting your finger, wearing a splint, and taking medicines.  In severe cases, surgery to open the tendon sheath may be needed. This information is not intended to replace advice given to you by your health care provider. Make sure you discuss any questions you have with your health care provider. Document Revised: 07/01/2018 Document Reviewed: 07/01/2018 Elsevier Patient Education  [place].  Shoulder Range of Motion Exercises Shoulder range of motion (ROM) exercises are done to keep the shoulder moving freely or to increase movement. They are often recommended for people who have shoulder pain or stiffness or who are recovering from a shoulder surgery. Phase 1 exercises When you are able, do this exercise 1-2 times per day for 30-60 seconds in each direction, or as directed by your health care provider. Pendulum exercise To do this exercise while sitting: 1. Sit in a chair or at the edge of your bed with your feet flat on the floor. 2. Let your affected arm hang down in front of you over the edge of the bed or chair. 3. Relax your shoulder, arm, and hand. [place] your body so your arm gently swings in small circles. You can also use your unaffected arm to start the motion. 5. Repeat changing the direction of the circles, swinging your arm left and right, and swinging your arm forward and back. To do this exercise while  standing: 1. Stand next to a sturdy chair or table, and hold on to it with your hand on your unaffected side. 2. Bend forward at the waist. 3. Bend your knees slightly. 4. Relax your shoulder, arm, and hand. 5. While keeping your shoulder relaxed, use body motion to swing your arm in small circles. 6. Repeat changing the direction of the circles, swinging your arm left and right, and swinging your arm forward and back. 7. Between exercises, stand up tall and take a short break to relax your lower back.  Phase 2 exercises Do these exercises 1-2 times per day or as told by your health care provider. Hold each stretch for 30 seconds, and repeat 3 times. Do the exercises with one or both arms as instructed by your health care provider. For these exercises, sit at a table with your hand and arm supported by the table. A chair that slides easily or has wheels can be helpful. External rotation 1. Turn your  chair so that your affected side is nearest to the table. 2. Place your forearm on the table to your side. Bend your elbow about 90 at the elbow (right angle) and place your hand palm facing down on the table. Your elbow should be about 6 inches away from your side. 3. Keeping your arm on the table, lean your body forward. Abduction 1. Turn your chair so that your affected side is nearest to the table. 2. Place your forearm and hand on the table so that your thumb points toward the ceiling and your arm is straight out to your side. 3. Slide your hand out to the side and away from you, using your unaffected arm to do the work. 4. To increase the stretch, you can slide your chair away from the table. Flexion: forward stretch 1. Sit facing the table. Place your hand and elbow on the table in front of you. 2. Slide your hand forward and away from you, using your unaffected arm to do the work. 3. To increase the stretch, you can slide your chair backward. Phase 3 exercises Do these exercises 1-2  times per day or as told by your health care provider. Hold each stretch for 30 seconds, and repeat 3 times. Do the exercises with one or both arms as instructed by your health care provider. Cross-body stretch: posterior capsule stretch 1. Lift your arm straight out in front of you. 2. Bend your arm 90 at the elbow (right angle) so your forearm moves across your body. 3. Use your other arm to gently pull the elbow across your body, toward your other shoulder. Wall climbs 1. Stand with your affected arm extended out to the side with your hand resting on a door frame. 2. Slide your hand slowly up the door frame. 3. To increase the stretch, step through the door frame. Keep your body upright and do not lean. Wand exercises You will need a cane, a piece of PVC pipe, or a sturdy wooden dowel for wand exercises. Flexion To do this exercise while standing: 1. Hold the wand with both of your hands, palms down. 2. Using the other arm to help, lift your arms up and over your head, if able. 3. Push upward with your other arm to gently increase the stretch. To do this exercise while lying down: 1. Lie on your back with your elbows resting on the floor and the wand in both your hands. Your hands will be palm down, or pointing toward your feet. 2. Lift your hands toward the ceiling, using your unaffected arm to help if needed. 3. Bring your arms overhead as able, using your unaffected arm to help if needed. Internal rotation 1. Stand while holding the wand behind you with both hands. Your unaffected arm should be extended above your head with the arm of the affected side extended behind you at the level of your waist. The wand should be pointing straight up and down as you hold it. 2. Slowly pull the wand up behind your back by straightening the elbow of your unaffected arm and bending the elbow of your affected arm. External rotation 1. Lie on your back with your affected upper arm supported on a small  pillow or rolled towel. When you first do this exercise, keep your upper arm close to your body. Over time, bring your arm up to a 90 angle out to the side. 2. Hold the wand across your stomach and with both hands palm up. Your  elbow on your affected side should be bent at a 90 angle. 3. Use your unaffected side to help push your forearm away from you and toward the floor. Keep your elbow on your affected side bent at a 90 angle. Contact a health care provider if you have:  New or increasing pain.  New numbness, tingling, weakness, or discoloration in your arm or hand. This information is not intended to replace advice given to you by your health care provider. Make sure you discuss any questions you have with your health care provider. Document Revised: 03/28/2017 Document Reviewed: 03/28/2017 Elsevier Patient Education  [place].  Proximal Biceps Tendon Tear Rehab Ask your health care provider which exercises are safe for you. Do exercises exactly as told by your health care provider and adjust them as directed. It is normal to feel mild stretching, pulling, tightness, or discomfort as you do these exercises. Stop right away if you feel sudden pain or your pain gets worse. Do not begin these exercises until told by your health care provider. Stretching and range-of-motion exercises These exercises warm up your muscles and joints and improve the movement and flexibility of your arm and shoulder. The exercises also help to relieve pain and stiffness. Shoulder pendulum  1. Stand near a table or counter that you can hold onto for balance. 2. Bend forward at the waist and let your left / right arm hang straight down. Use your other arm to support you and help you stay balanced. 3. Relax your left / right arm and shoulder muscles, and move your hips and your trunk so your left / right arm swings freely. Your arm should swing because of the motion of your body, not because you are using  your arm or shoulder muscles. 4. Keep moving your hips and trunk so your arm swings in the following directions, as told by your health care provider: ? Side to side. ? Forward and backward. ? In clockwise and counterclockwise circles. Repeat __________ times. Complete this exercise __________ times a day. Single-arm shoulder flexion, assisted You will need a stick, broom handle, or similar object to help (assist) in doing this exercise. 1. Lie on your back. Grasp the bottom of the stick in your left / right hand. 2. Using the stick, raise your arm overhead until you feel a gentle stretch (flexion). 3. Hold this position for _________ seconds. 4. Return to the starting position. Repeat __________ times. Complete this exercise __________ times a day. Double-arm shoulder flexion, assisted You will need a stick, broom handle, or similar object to help (assist) in doing this exercise. 1. Lie on your back. Hold the stick in both hands, with your hands shoulder-width apart. 2. Raise both hands over your head, stopping when you feel a gentle stretch in your left / right shoulder (flexion). 3. Hold this position for _________ seconds. 4. Return to the starting position. Repeat __________ times. Complete this exercise __________ times a day. Shoulder flexion, assisted  1. Stand facing a wall. Put your left / right palm on the wall. 2. Slowly move your left / right hand up the wall (flexion). Stop when you feel a gentle stretch in your shoulder, or when you reach the angle that is recommended by your health care provider. ? Use your other hand to help raise your arm, if needed (assisted). ? As your hand gets higher, you may need to step closer to the wall. ? Avoid shrugging or lifting your shoulder up as you  raise your arm. To do this, keep your shoulder blade tucked down toward your spine. 3. Hold this position for __________ seconds. 4. Slowly return to the starting position. Use your other arm  to help, if needed. Repeat __________ times. Complete this exercise __________ times a day. Shoulder abduction, assisted You will need a stick, broom handle, or similar object to help you (assist) in doing this exercise. 1. Lie on your back. Grasp the bottom of the stick in your left / right hand. Your thumb should be pointing upward. 2. Using the stick, slowly push your arm away from your side (abduction) and over your head until you feel a gentle stretch. 3. Hold this position for __________ seconds. 4. Return to the starting position. Repeat __________ times. Complete this exercise __________ times a day. Elbow extension stretch You may do this exercise in two ways:  Lie on your back and rest your elbow off the edge of the bed.  Sit at a table with your upper arm supported as if you are lying down, and let your elbow rest off the table. 1. Let the weight of your hand, wrist, and forearm straighten your elbow (extension) until you feel a gentle stretch. 2. If approved by your health care provider, you may hold a _________ weight in your hand to help you stretch farther. 3. Hold this position for _________ seconds. 4. Slowly return to the starting position. Repeat __________ times. Complete this exercise __________ times a day. Strengthening exercises These exercises build strength and endurance in your arm and shoulder. Endurance is the ability to use your muscles for a long time, even after they get tired. Do not start these exercises until instructed to do so by your health care provider. Forearm rotation  1. Sit with your left / right forearm supported on a table. Your elbow should be at waist height. 2. If directed, hold a hammer in your left / right hand. 3. Rest your hand over the edge of the table so your palm faces down. 4. Without moving your left / right elbow, slowly rotate your hand so your palm faces up toward the ceiling. ? If you are holding a hammer, begin by holding the  hammer near the head. When this exercise gets easier for you, hold the hammer farther down the handle. 5. Hold this position for __________ seconds. 6. Slowly return to the starting position. Repeat __________ times. Complete this exercise __________ times a day. Isometric elbow flexion 1. Stand or sit with your left / right arm at waist height. Your palm should face in, toward your body. 2. Place your other hand on top of your left / right forearm. Gently push down while you resist with your left / right arm (isometric flexion). ? Use about 50% effort with both arms. You may be instructed to use more and more effort with your arms each week. ? Try not to let your left / right elbow move during the exercise. 3. Hold this position for _________ seconds. 4. Let your muscles relax completely before you repeat this exercise. Repeat __________ times. Complete this exercise __________ times a day. Biceps curls You may use a weight or an exercise band to do this exercise. 1. Sit on a stable chair without armrests, or stand up. 2. Hold a __________ weight in your left / right hand, or hold an exercise band with both hands. Your palms should face up toward the ceiling at the starting position. 3. Bend your left /  right elbow and move your hand up toward your shoulder. Keep your other arm straight down, in the starting position. 4. Hold this position for __________ seconds. 5. Slowly return to the starting position. Repeat __________ times. Complete this exercise __________ times a day. Hammer curls You may use a weight or an exercise band to do this exercise. 1. Sit on a stable chair without armrests, or stand up. 2. Hold a __________ weight in your left / right hand, or hold an exercise band with both hands. Your palms should face each other at the starting position. 3. Keeping your other arm straight, bend your left / right elbow and move your hand up toward your shoulder. Keep your elbow at your  side while you bend it. 4. Slowly return to the starting position. Repeat __________ times. Complete this exercise __________ times a day. Isometric shoulder flexion 1. Stand facing a wall. Make a light fist and put your left / right hand on the wall, thumb toward the wall. 2. Push against the wall, trying to raise your arm straight in front of you (flexion). The wall will stop any motion (isometric flexion). 3. Hold this position for __________ seconds. 4. Let your arm muscles relax completely before you repeat this exercise. Repeat __________ times. Complete this exercise __________ times a day. Shoulder flexion You may use a weight or an exercise band to do this exercise. 1. Stand holding a _________ weight in your left / right hand, or hold an exercise band in both hands. 2. Slowly raise your left / right arm overhead as far as you can (flexion) without feeling pain. ? Do not allow your shoulder to lift up while doing this exercise. ? Keep your thumb pointing up to the ceiling. 3. Hold this position for __________ seconds. 4. Slowly return to the starting position. Repeat __________ times. Complete this exercise __________ times a day. Scapular retraction Scapular retraction is the process of pulling the shoulder blades (scapulae) toward each other, and toward the spine. You will need an exercise band to do this exercise. 1. Sit in a stable chair without armrests, or stand up. 2. Secure an exercise band to a stable object in front of you so the band is at shoulder height. 3. Hold one end of the exercise band in each hand. 4. Squeeze your shoulder blades together and move your elbows slightly behind you (retraction). Do not shrug your shoulders upward while you do this. 5. Hold this position for __________ seconds. 6. Slowly return to the starting position. Repeat __________ times. Complete this exercise __________ times a day. Scapular protraction, supine Scapular protraction is the  process of moving your shoulder blades away from each other, and away from the spine, while you lie on your back (supine position). 1. Lie on your back on a firm surface. Hold a __________ weight in your left / right hand. 2. Raise your left / right arm straight into the air so your hand is directly above your shoulder joint. 3. Push the weight into the air so your shoulder (scapula) lifts off the surface that you are lying on. Think of trying to punch the ceiling by only moving your scapula forward (protraction). Do not move your head, neck, or back. 4. Hold this position for __________ seconds. 5. Slowly return to the starting position. Repeat __________ times. Complete this exercise __________ times a day. This information is not intended to replace advice given to you by your health care provider. Make sure you  discuss any questions you have with your health care provider. Document Revised: 06/04/2018 Document Reviewed: 02/25/2018 Elsevier Patient Education  2020 [employer].

## 2020-01-14 NOTE — Assessment & Plan Note (Signed)
Still not under great control. Will increase her sertraline to 150mg  and continue wellbutrin. Call with any concerns. Continue to monitor.

## 2020-06-17 ENCOUNTER — Encounter: Payer: Self-pay | Admitting: Family Medicine

## 2020-06-22 DIAGNOSIS — E785 Hyperlipidemia, unspecified: Secondary | ICD-10-CM | POA: Insufficient documentation

## 2020-06-22 NOTE — Progress Notes (Signed)
BP (!) 160/92   Pulse 72   Temp 98.4 F (36.9 C)   Wt 151 lb 2 oz (68.5 kg)   SpO2 99%   BMI 26.77 kg/m    Subjective:    Patient ID: Krista Phelps, female    DOB: 1955-07-14, 65 y.o.   MRN: [id number]  HPI: Krista Phelps is a 65 y.o. female  Chief Complaint  Patient presents with  . Depression   DEPRESSION Patient feels like her mood is well controlled.  Denies SI.  Does not feel like medications need to be adjusted. Has been out of Zoloft for about 1 week.  [facility] Office Visit from 06/23/2020 in [facility]  PHQ-9 Total Score 13     HYPERLIPIDEMIA/HYPERTENSION Patient not currently taking any blood pressure or cholesterol medication.  She has tried a statin in the past and it caused her to have muscle aches.   Denies HA, CP, SOB, dizziness, palpitations, visual changes, and lower extremity swelling.  The 10-year ASCVD risk score Mikey Bussing DC Brooke Bonito., et al., 2013) is: 9.4%   Values used to calculate the score:     Age: 75 years     Sex: Female     Is Non-Hispanic African American: No     Diabetic: No     Tobacco smoker: No     Systolic Blood Pressure: 182 mmHg     Is BP treated: No     HDL Cholesterol: 74 mg/dL     Total Cholesterol: 253 mg/dL   Relevant past medical, surgical, family and social history reviewed and updated as indicated. Interim medical history since our last visit reviewed. Allergies and medications reviewed and updated.  Review of Systems  Eyes: Negative for visual disturbance.  Respiratory: Negative for cough, chest tightness and shortness of breath.   Cardiovascular: Negative for chest pain, palpitations and leg swelling.  Neurological: Negative for dizziness and headaches.  Psychiatric/Behavioral: Positive for dysphoric mood. Negative for suicidal ideas.    Per HPI unless specifically indicated above     Objective:    BP (!) 160/92   Pulse 72   Temp 98.4 F (36.9 C)   Wt 151 lb 2 oz (68.5 kg)   SpO2 99%   BMI 26.77  kg/m   Wt Readings from Last 3 Encounters:  06/23/20 151 lb 2 oz (68.5 kg)  01/14/20 147 lb (66.7 kg)  04/24/19 171 lb (77.6 kg)    Physical Exam Vitals and nursing note reviewed.  Constitutional:      General: She is not in acute distress.    Appearance: Normal appearance. She is normal weight. She is not ill-appearing, toxic-appearing or diaphoretic.  HENT:     Head: Normocephalic.     Right Ear: External ear normal.     Left Ear: External ear normal.     Nose: Nose normal.     Mouth/Throat:     Mouth: Mucous membranes are moist.     Pharynx: Oropharynx is clear.  Eyes:     General:        Right eye: No discharge.        Left eye: No discharge.     Extraocular Movements: Extraocular movements intact.     Conjunctiva/sclera: Conjunctivae normal.     Pupils: Pupils are equal, round, and reactive to light.  Cardiovascular:     Rate and Rhythm: Normal rate and regular rhythm.     Heart sounds: No murmur heard.   Pulmonary:  Effort: Pulmonary effort is normal. No respiratory distress.     Breath sounds: Normal breath sounds. No wheezing or rales.  Musculoskeletal:     Cervical back: Normal range of motion and neck supple.  Skin:    General: Skin is warm and dry.     Capillary Refill: Capillary refill takes less than 2 seconds.  Neurological:     General: No focal deficit present.     Mental Status: She is alert and oriented to person, place, and time. Mental status is at baseline.  Psychiatric:        Mood and Affect: Mood normal.        Behavior: Behavior normal.        Thought Content: Thought content normal.        Judgment: Judgment normal.     Results for orders placed or performed in visit on 99/37/16  Basic metabolic panel  Result Value Ref Range   Glucose 89 65 - 99 mg/dL   BUN 15 8 - 27 mg/dL   Creatinine, Ser 0.90 0.57 - 1.00 mg/dL   GFR calc non Af Amer 68 >59 mL/min/1.73   GFR calc Af Amer 78 >59 mL/min/1.73   BUN/Creatinine Ratio 17 12 - 28    Sodium 143 134 - 144 mmol/L   Potassium 4.1 3.5 - 5.2 mmol/L   Chloride 103 96 - 106 mmol/L   CO2 27 20 - 29 mmol/L   Calcium 9.9 8.7 - 10.3 mg/dL      Assessment & Plan:   Problem List Items Addressed This Visit      Other   Depression, recurrent ([place]) - Primary    Chronic. Patient feels like her depression is well controlled despite PHQ9. Continue with current regimen.  Will reassess at future visits.  Denies SI. Return if symptoms worsen.      Relevant Medications   sertraline (ZOLOFT) 100 MG tablet   Other Relevant Orders   Comprehensive metabolic panel   Hyperlipidemia    Chronic.  Labs ordered today.  Patient declines medication at visit today.  Reviewed Cardiac Risk score with patient today.  Patient is going to check BP at home over the next month and return to clinic with log. If blood pressure is still elevated and cardiac risk is still elevated, will discuss medication at that time.       Relevant Orders   Comprehensive metabolic panel   Lipid panel    Other Visit Diagnoses    Encounter for screening mammogram for malignant neoplasm of breast       Relevant Orders   MM Digital Screening   Encounter for hepatitis C screening test for low risk patient       Relevant Orders   Hepatitis C Antibody   Screening for osteoporosis       Patient declines DEXA scan.    Elevated blood pressure reading       Check BP at home over the next month and return to clinic with log. If blood pressure and Cardiaccardiac risk is still elevated, will rediscuss medications.   Immunization due           Follow up plan: Return in about 1 month (around 07/23/2020) for BP Check.    A total of 30 minutes were spent on this encounter today.  When total time is documented, this includes both the face-to-face and non-face-to-face time personally spent before, during and after the visit on the date of the encounter.

## 2020-06-23 ENCOUNTER — Ambulatory Visit (INDEPENDENT_AMBULATORY_CARE_PROVIDER_SITE_OTHER): Payer: Commercial Managed Care - PPO | Admitting: Nurse Practitioner

## 2020-06-23 ENCOUNTER — Other Ambulatory Visit: Payer: Self-pay

## 2020-06-23 ENCOUNTER — Encounter: Payer: Self-pay | Admitting: Nurse Practitioner

## 2020-06-23 VITALS — BP 160/92 | HR 72 | Temp 98.4°F | Wt 151.1 lb

## 2020-06-23 DIAGNOSIS — Z23 Encounter for immunization: Secondary | ICD-10-CM

## 2020-06-23 DIAGNOSIS — Z1159 Encounter for screening for other viral diseases: Secondary | ICD-10-CM

## 2020-06-23 DIAGNOSIS — R030 Elevated blood-pressure reading, without diagnosis of hypertension: Secondary | ICD-10-CM

## 2020-06-23 DIAGNOSIS — E782 Mixed hyperlipidemia: Secondary | ICD-10-CM | POA: Diagnosis not present

## 2020-06-23 DIAGNOSIS — F339 Major depressive disorder, recurrent, unspecified: Secondary | ICD-10-CM | POA: Diagnosis not present

## 2020-06-23 DIAGNOSIS — Z13820 Encounter for screening for osteoporosis: Secondary | ICD-10-CM

## 2020-06-23 DIAGNOSIS — Z1231 Encounter for screening mammogram for malignant neoplasm of breast: Secondary | ICD-10-CM | POA: Diagnosis not present

## 2020-06-23 DIAGNOSIS — Z0000 Encounter for general adult medical examination without abnormal findings: Secondary | ICD-10-CM

## 2020-06-23 DIAGNOSIS — Z862 Personal history of diseases of the blood and blood-forming organs and certain disorders involving the immune mechanism: Secondary | ICD-10-CM

## 2020-06-23 MED ORDER — SERTRALINE HCL 100 MG PO TABS: 150.0000 mg | ORAL_TABLET | Freq: Every day | ORAL | 1 refills | Status: DC

## 2020-06-23 NOTE — Assessment & Plan Note (Signed)
Chronic. Patient feels like her depression is well controlled despite PHQ9. Continue with current regimen.  Will reassess at future visits.  Denies SI. Return if symptoms worsen.

## 2020-06-23 NOTE — Assessment & Plan Note (Signed)
Chronic.  Labs ordered today.  Patient declines medication at visit today.  Reviewed Cardiac Risk score with patient today.  Patient is going to check BP at home over the next month and return to clinic with log. If blood pressure is still elevated and cardiac risk is still elevated, will discuss medication at that time.

## 2020-06-24 LAB — COMPREHENSIVE METABOLIC PANEL
ALT: 14 IU/L (ref 0–32)
AST: 16 IU/L (ref 0–40)
Albumin/Globulin Ratio: 2.8 — ABNORMAL HIGH (ref 1.2–2.2)
Albumin: 4.8 g/dL (ref 3.8–4.8)
Alkaline Phosphatase: 66 IU/L (ref 44–121)
BUN/Creatinine Ratio: 19 (ref 12–28)
BUN: 18 mg/dL (ref 8–27)
Bilirubin Total: 0.3 mg/dL (ref 0.0–1.2)
CO2: 24 mmol/L (ref 20–29)
Calcium: 9.8 mg/dL (ref 8.7–10.3)
Chloride: 100 mmol/L (ref 96–106)
Creatinine, Ser: 0.94 mg/dL (ref 0.57–1.00)
Globulin, Total: 1.7 g/dL (ref 1.5–4.5)
Glucose: 78 mg/dL (ref 65–99)
Potassium: 4.8 mmol/L (ref 3.5–5.2)
Sodium: 141 mmol/L (ref 134–144)
Total Protein: 6.5 g/dL (ref 6.0–8.5)
eGFR: 67 mL/min/{1.73_m2} (ref >59)

## 2020-06-24 LAB — LIPID PANEL
Chol/HDL Ratio: 3.4 ratio (ref 0.0–4.4)
Cholesterol, Total: 297 mg/dL — ABNORMAL HIGH (ref 100–199)
HDL: 88 mg/dL (ref >39)
LDL Chol Calc (NIH): 194 mg/dL — ABNORMAL HIGH (ref 0–99)
Triglycerides: 91 mg/dL (ref 0–149)
VLDL Cholesterol Cal: 15 mg/dL (ref 5–40)

## 2020-06-24 LAB — HEPATITIS C ANTIBODY: Hep C Virus Ab: <0.1 s/co ratio (ref 0.0–0.9)

## 2020-06-24 NOTE — Progress Notes (Signed)
Hi Neelam, it was nice to meet you yesterday.  Your lab work shows that your cholesterol is still elevated.  I know we discussed the cardiac risk score in our visit yesterday and your lab keeps the score in the high range.  With that said, I do recommend a Statin.  If you are not able to tolerate the Statin we can try Zetia which is in a different class of medications and typically comes with less side effects. Otherwise, your lab work looks good.  We can discuss this further in a month at your follow up visit if you would like. Have a great day!

## 2020-07-23 ENCOUNTER — Ambulatory Visit: Payer: Commercial Managed Care - PPO | Admitting: Nurse Practitioner

## 2020-07-23 ENCOUNTER — Other Ambulatory Visit: Payer: Self-pay

## 2020-07-23 ENCOUNTER — Encounter: Payer: Self-pay | Admitting: Nurse Practitioner

## 2020-07-23 VITALS — BP 145/81 | HR 67 | Temp 98.6°F | Wt 150.0 lb

## 2020-07-23 DIAGNOSIS — I10 Essential (primary) hypertension: Secondary | ICD-10-CM | POA: Diagnosis not present

## 2020-07-23 DIAGNOSIS — F339 Major depressive disorder, recurrent, unspecified: Secondary | ICD-10-CM

## 2020-07-23 MED ORDER — LISINOPRIL 20 MG PO TABS: 20.0000 mg | ORAL_TABLET | Freq: Every day | ORAL | 0 refills | Status: DC

## 2020-07-23 NOTE — Assessment & Plan Note (Signed)
Chronic. Patient feels like her depression is well controlled and is back on Zoloft.  Continue with current regimen.  Will reassess at future visits.  Denies SI. Return if symptoms worsen.

## 2020-07-23 NOTE — Progress Notes (Signed)
BP (!) 145/81 (BP Location: Right Arm, Cuff Size: Normal)   Pulse 67   Temp 98.6 F (37 C) (Oral)   Wt 150 lb (68 kg)   SpO2 97%   BMI 26.57 kg/m    Subjective:    Patient ID: Krista Phelps, female    DOB: [birth year], 65 y.o.   MRN: [id number]  HPI: Krista Phelps is a 65 y.o. female  Chief Complaint  Patient presents with  . Hypertension    1 month f/up- has log on BP recordings   HYPERTENSION Hypertension status: uncontrolled  Satisfied with current treatment? none Duration of hypertension: months BP monitoring frequency:  a few times a week BP range:  BP medication side effects:  no Medication compliance: none Previous BP meds:none Aspirin: no Recurrent headaches: no Visual changes: no Palpitations: no Dyspnea: no Chest pain: no Lower extremity edema: no Dizzy/lightheaded: no  DEPRESSION Patient states she is back on her Zoloft. Thinks he depression is well controlled. Denies concerns at visit today.  Denies SI.   Relevant past medical, surgical, family and social history reviewed and updated as indicated. Interim medical history since our last visit reviewed. Allergies and medications reviewed and updated.  Review of Systems  Eyes: Negative for visual disturbance.  Respiratory: Negative for cough, chest tightness and shortness of breath.   Cardiovascular: Negative for chest pain, palpitations and leg swelling.  Neurological: Negative for dizziness and headaches.  Psychiatric/Behavioral: Negative for dysphoric mood and suicidal ideas.    Per HPI unless specifically indicated above     Objective:    BP (!) 145/81 (BP Location: Right Arm, Cuff Size: Normal)   Pulse 67   Temp 98.6 F (37 C) (Oral)   Wt 150 lb (68 kg)   SpO2 97%   BMI 26.57 kg/m   Wt Readings from Last 3 Encounters:  07/23/20 150 lb (68 kg)  06/23/20 151 lb 2 oz (68.5 kg)  01/14/20 147 lb (66.7 kg)    Physical Exam Vitals and nursing note reviewed.  Constitutional:       General: She is not in acute distress.    Appearance: Normal appearance. She is normal weight. She is not ill-appearing, toxic-appearing or diaphoretic.  HENT:     Head: Normocephalic.     Right Ear: External ear normal.     Left Ear: External ear normal.     Nose: Nose normal.     Mouth/Throat:     Mouth: Mucous membranes are moist.     Pharynx: Oropharynx is clear.  Eyes:     General:        Right eye: No discharge.        Left eye: No discharge.     Extraocular Movements: Extraocular movements intact.     Conjunctiva/sclera: Conjunctivae normal.     Pupils: Pupils are equal, round, and reactive to light.  Cardiovascular:     Rate and Rhythm: Normal rate and regular rhythm.     Heart sounds: No murmur heard.   Pulmonary:     Effort: Pulmonary effort is normal. No respiratory distress.     Breath sounds: Normal breath sounds. No wheezing or rales.  Musculoskeletal:     Cervical back: Normal range of motion and neck supple.  Skin:    General: Skin is warm and dry.     Capillary Refill: Capillary refill takes less than 2 seconds.  Neurological:     General: No focal deficit present.     Mental  Status: She is alert and oriented to person, place, and time. Mental status is at baseline.  Psychiatric:        Mood and Affect: Mood normal.        Behavior: Behavior normal.        Thought Content: Thought content normal.        Judgment: Judgment normal.     Results for orders placed or performed in visit on 06/23/20  Comprehensive metabolic panel  Result Value Ref Range   Glucose 78 65 - 99 mg/dL   BUN 18 8 - 27 mg/dL   Creatinine, Ser 0.94 0.57 - 1.00 mg/dL   eGFR 67 >59 mL/min/1.73   BUN/Creatinine Ratio 19 12 - 28   Sodium 141 134 - 144 mmol/L   Potassium 4.8 3.5 - 5.2 mmol/L   Chloride 100 96 - 106 mmol/L   CO2 24 20 - 29 mmol/L   Calcium 9.8 8.7 - 10.3 mg/dL   Total Protein 6.5 6.0 - 8.5 g/dL   Albumin 4.8 3.8 - 4.8 g/dL   Globulin, Total 1.7 1.5 - 4.5 g/dL    Albumin/Globulin Ratio 2.8 (H) 1.2 - 2.2   Bilirubin Total 0.3 0.0 - 1.2 mg/dL   Alkaline Phosphatase 66 44 - 121 IU/L   AST 16 0 - 40 IU/L   ALT 14 0 - 32 IU/L  Lipid panel  Result Value Ref Range   Cholesterol, Total 297 (H) 100 - 199 mg/dL   Triglycerides 91 0 - 149 mg/dL   HDL 88 >39 mg/dL   VLDL Cholesterol Cal 15 5 - 40 mg/dL   LDL Chol Calc (NIH) 194 (H) 0 - 99 mg/dL   Chol/HDL Ratio 3.4 0.0 - 4.4 ratio  Hepatitis C Antibody  Result Value Ref Range   Hep C Virus Ab <0.1 0.0 - 0.9 s/co ratio      Assessment & Plan:   Problem List Items Addressed This Visit      Cardiovascular and Mediastinum   Hypertension    New diagnosis.  Blood pressures continue to be elevated at home.  Recommend starting lisinopril 40m daily. Side effects and benefits discussed with patient during visit.  Continue to check BP at home.  Follow up in 1 month.      Relevant Medications   lisinopril (ZESTRIL) 20 MG tablet     Other   [place], [place]    Chronic. Patient feels like her depression is well controlled and is back on Zoloft.  Continue with current regimen.  Will reassess at future visits.  Denies SI. Return if symptoms worsen.          Follow up plan: Return in about 1 month (around 08/23/2020) for BP Check.    A total of 30 minutes were spent on this encounter today.  When total time is documented, this includes both the face-to-face and non-face-to-face time personally spent before, during and after the visit on the date of the encounter.

## 2020-07-23 NOTE — Assessment & Plan Note (Signed)
New diagnosis.  Blood pressures continue to be elevated at home.  Recommend starting lisinopril 20mg  daily. Side effects and benefits discussed with patient during visit.  Continue to check BP at home.  Follow up in 1 month.

## 2020-08-23 NOTE — Progress Notes (Signed)
BP 130/74   Pulse 68   Temp 97.6 F (36.4 C) (Oral)   Wt 149 lb 12.8 oz (67.9 kg)   SpO2 98%   BMI 26.54 kg/m    Subjective:    Patient ID: Krista Phelps, female    DOB: 21-Apr-1955, 65 y.o.   MRN: [id number]  HPI: Krista Phelps is a 64 y.o. female  Chief Complaint  Patient presents with   Hypertension   Cough    HYPERTENSION Hypertension status: stable  Satisfied with current treatment? yes Duration of hypertension: years BP monitoring frequency:  daily BP range: 130-160/80 BP medication side effects:  no Medication compliance: excellent compliance Previous BP meds:lisinopril Aspirin: no Recurrent headaches: no Visual changes: no Palpitations: no Dyspnea: no Chest pain: no Lower extremity edema: no Dizzy/lightheaded: no  COUGH Patient states she is a former smoker and her cough is getting worse.  She quit smoking in 1985 but smoked for about 10 years. Patient states she feels like there is phlegm in her throat.  Patient states over the last 2-3 months the cough has worsened.  It is worse at night especially because of her reflux.  Patient had the cough prior to starting on Lisinopril.    Relevant past medical, surgical, family and social history reviewed and updated as indicated. Interim medical history since our last visit reviewed. Allergies and medications reviewed and updated.  Review of Systems  Eyes:  Negative for visual disturbance.  Respiratory:  Positive for cough. Negative for chest tightness and shortness of breath.   Cardiovascular:  Negative for chest pain, palpitations and leg swelling.  Neurological:  Negative for dizziness and headaches.   Per HPI unless specifically indicated above     Objective:    BP 130/74   Pulse 68   Temp 97.6 F (36.4 C) (Oral)   Wt 149 lb 12.8 oz (67.9 kg)   SpO2 98%   BMI 26.54 kg/m   Wt Readings from Last 3 Encounters:  08/24/20 149 lb 12.8 oz (67.9 kg)  07/23/20 150 lb (68 kg)  06/23/20 151 lb 2 oz  (68.5 kg)    Physical Exam Vitals and nursing note reviewed.  Constitutional:      General: She is not in acute distress.    Appearance: Normal appearance. She is normal weight. She is not ill-appearing, toxic-appearing or diaphoretic.  HENT:     Head: Normocephalic.     Right Ear: External ear normal.     Left Ear: External ear normal.     Nose: Nose normal.     Mouth/Throat:     Mouth: Mucous membranes are moist.     Pharynx: Oropharynx is clear.  Eyes:     General:        Right eye: No discharge.        Left eye: No discharge.     Extraocular Movements: Extraocular movements intact.     Conjunctiva/sclera: Conjunctivae normal.     Pupils: Pupils are equal, round, and reactive to light.  Cardiovascular:     Rate and Rhythm: Normal rate and regular rhythm.     Heart sounds: No murmur heard. Pulmonary:     Effort: Pulmonary effort is normal. No respiratory distress.     Breath sounds: Normal breath sounds. No wheezing or rales.  Musculoskeletal:     Cervical back: Normal range of motion and neck supple.  Skin:    General: Skin is warm and dry.     Capillary Refill: Capillary  refill takes less than 2 seconds.  Neurological:     General: No focal deficit present.     Mental Status: She is alert and oriented to person, place, and time. Mental status is at baseline.  Psychiatric:        Mood and Affect: Mood normal.        Behavior: Behavior normal.        Thought Content: Thought content normal.        Judgment: Judgment normal.    Results for orders placed or performed in visit on 06/23/20  Comprehensive metabolic panel  Result Value Ref Range   Glucose 78 65 - 99 mg/dL   BUN 18 8 - 27 mg/dL   Creatinine, Ser 0.94 0.57 - 1.00 mg/dL   eGFR 67 >59 mL/min/1.73   BUN/Creatinine Ratio 19 12 - 28   Sodium 141 134 - 144 mmol/L   Potassium 4.8 3.5 - 5.2 mmol/L   Chloride 100 96 - 106 mmol/L   CO2 24 20 - 29 mmol/L   Calcium 9.8 8.7 - 10.3 mg/dL   Total Protein 6.5 6.0 -  8.5 g/dL   Albumin 4.8 3.8 - 4.8 g/dL   Globulin, Total 1.7 1.5 - 4.5 g/dL   Albumin/Globulin Ratio 2.8 (H) 1.2 - 2.2   Bilirubin Total 0.3 0.0 - 1.2 mg/dL   Alkaline Phosphatase 66 44 - 121 IU/L   AST 16 0 - 40 IU/L   ALT 14 0 - 32 IU/L  Lipid panel  Result Value Ref Range   Cholesterol, Total 297 (H) 100 - 199 mg/dL   Triglycerides 91 0 - 149 mg/dL   HDL 88 >39 mg/dL   VLDL Cholesterol Cal 15 5 - 40 mg/dL   LDL Chol Calc (NIH) 194 (H) 0 - 99 mg/dL   Chol/HDL Ratio 3.4 0.0 - 4.4 ratio  Hepatitis C Antibody  Result Value Ref Range   Hep C Virus Ab <0.1 0.0 - 0.9 s/co ratio      Assessment & Plan:   Problem List Items Addressed This Visit       Cardiovascular and Mediastinum   Hypertension    Chronic.  Blood pressure still persistently greater than 140/90. Will increase lisinopril to 41m daily. Follow up in 2 months. Continue to check blood pressures at home. Call sooner if concerns arise.        Relevant Medications   lisinopril (ZESTRIL) 40 MG tablet   Other Visit Diagnoses     Cough    -  Primary   Referral placed for patien to see Pulmonology Cough specialist due to duration of cough.   Relevant Orders   Ambulatory referral to Pulmonology        Follow up plan: Return in about 2 months (around 10/24/2020) for BP Check, cough.

## 2020-08-24 ENCOUNTER — Ambulatory Visit: Payer: Commercial Managed Care - PPO | Admitting: Nurse Practitioner

## 2020-08-24 ENCOUNTER — Encounter: Payer: Self-pay | Admitting: Nurse Practitioner

## 2020-08-24 ENCOUNTER — Other Ambulatory Visit: Payer: Self-pay

## 2020-08-24 VITALS — BP 130/74 | HR 68 | Temp 97.6°F | Wt 149.8 lb

## 2020-08-24 DIAGNOSIS — R059 Cough, unspecified: Secondary | ICD-10-CM

## 2020-08-24 DIAGNOSIS — I10 Essential (primary) hypertension: Secondary | ICD-10-CM | POA: Diagnosis not present

## 2020-08-24 MED ORDER — LISINOPRIL 40 MG PO TABS: 20.0000 mg | ORAL_TABLET | Freq: Every day | ORAL | 0 refills | Status: DC

## 2020-08-24 NOTE — Assessment & Plan Note (Signed)
Chronic.  Blood pressure still persistently greater than 140/90. Will increase lisinopril to 40mg  daily. Follow up in 2 months. Continue to check blood pressures at home. Call sooner if concerns arise.

## 2020-08-25 MED ORDER — LISINOPRIL 40 MG PO TABS: 40.0000 mg | ORAL_TABLET | Freq: Every day | ORAL | 0 refills | Status: DC

## 2020-08-25 MED ORDER — BUPROPION HCL ER (XL) 300 MG PO TB24: 300.0000 mg | ORAL_TABLET | Freq: Every day | ORAL | 1 refills | Status: DC

## 2020-08-25 MED ORDER — MELOXICAM 15 MG PO TABS: 15.0000 mg | ORAL_TABLET | Freq: Every day | ORAL | 1 refills | Status: DC

## 2020-09-13 ENCOUNTER — Telehealth: Payer: Self-pay | Admitting: *Deleted

## 2020-09-13 ENCOUNTER — Encounter: Payer: Self-pay | Admitting: Internal Medicine

## 2020-09-13 ENCOUNTER — Ambulatory Visit
Admission: RE | Admit: 2020-09-13 | Discharge: 2020-09-13 | Disposition: A | Discharge status: Home / Self Care | Payer: Commercial Managed Care - PPO | Attending: Internal Medicine | Admitting: Internal Medicine

## 2020-09-13 ENCOUNTER — Ambulatory Visit
Admission: RE | Admit: 2020-09-13 | Discharge: 2020-09-13 | Disposition: A | Discharge status: Home / Self Care | Payer: Commercial Managed Care - PPO | Source: Ambulatory Visit | Attending: Internal Medicine | Admitting: Internal Medicine

## 2020-09-13 ENCOUNTER — Other Ambulatory Visit: Payer: Self-pay

## 2020-09-13 ENCOUNTER — Ambulatory Visit (INDEPENDENT_AMBULATORY_CARE_PROVIDER_SITE_OTHER): Payer: Commercial Managed Care - PPO | Admitting: Internal Medicine

## 2020-09-13 VITALS — BP 110/60 | HR 107 | Temp 98.2°F | Ht 62.5 in | Wt 148.0 lb

## 2020-09-13 DIAGNOSIS — R059 Cough, unspecified: Secondary | ICD-10-CM

## 2020-09-13 DIAGNOSIS — J411 Mucopurulent chronic bronchitis: Secondary | ICD-10-CM

## 2020-09-13 LAB — HM MAMMOGRAPHY

## 2020-09-13 IMAGING — CR DG CHEST 2V
1 series · 2 of 2 positions shown · non-contrast
Comparison: None

CLINICAL DATA: Cough that is slightly productive for about 10
months, worsening, shortness of breath with exertion, sometimes
vomiting after coughing

EXAM:
CHEST - 2 VIEW

[Series 1: w chest pa · 0.14mm/px · 2 of 2 slices shown]
[im 1/2]
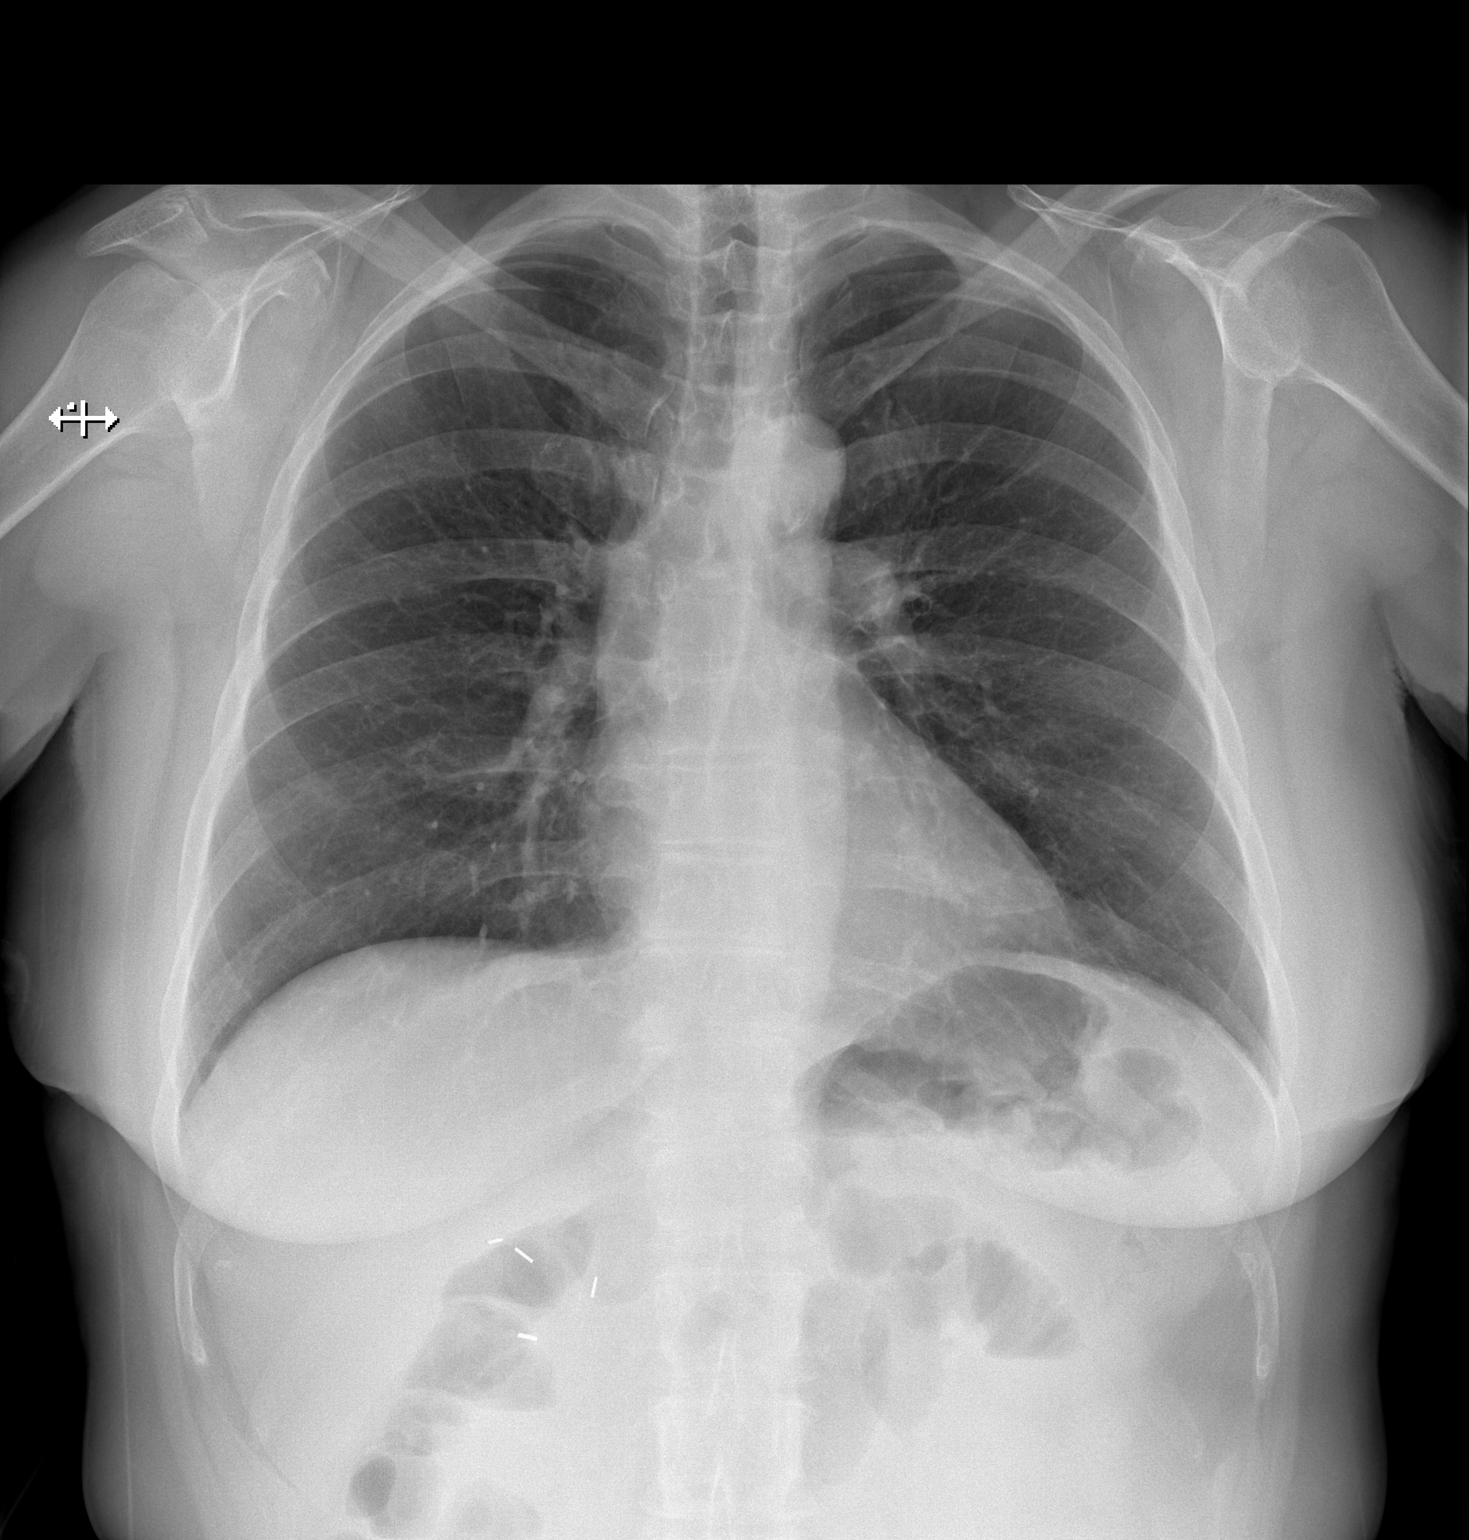
[im 2/2]
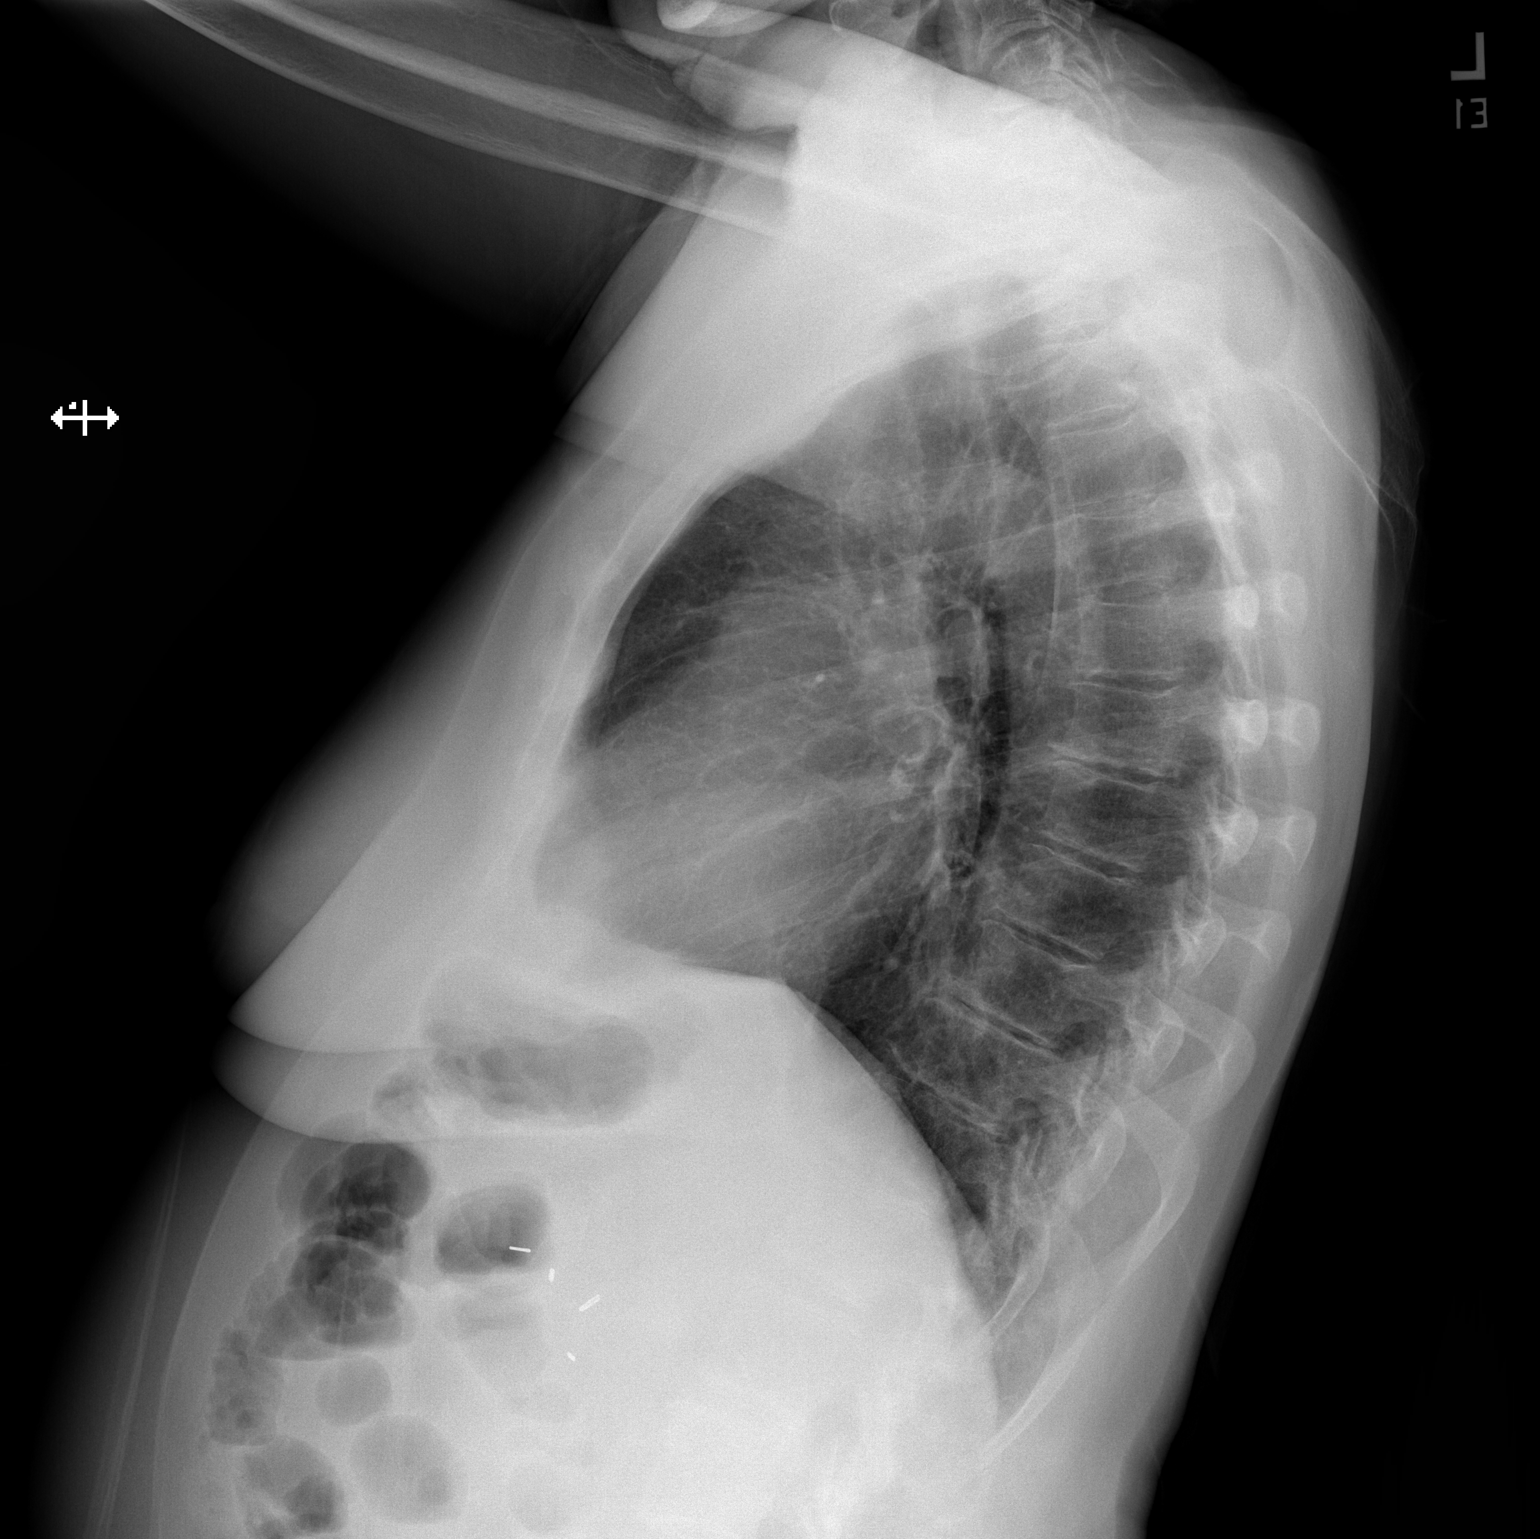

[2 of 2 positions shown; findings below may reference images not displayed]

FINDINGS: Normal heart size, mediastinal contours, and pulmonary vascularity.

Lungs clear.

No infiltrate, pleural effusion, or pneumothorax.

Osseous structures unremarkable.
IMPRESSION: No acute abnormalities.

## 2020-09-13 MED ORDER — BREZTRI AEROSPHERE 160-9-4.8 MCG/ACT IN AERO: 2.0000 | INHALATION_SPRAY | Freq: Two times a day (BID) | RESPIRATORY_TRACT | 0 refills | Status: DC

## 2020-09-13 MED ORDER — BREZTRI AEROSPHERE 160-9-4.8 MCG/ACT IN AERO
2.0000 | INHALATION_SPRAY | Freq: Two times a day (BID) | RESPIRATORY_TRACT | 5 refills | Status: DC
Start: 2020-09-13 — End: 2021-01-26

## 2020-09-13 MED ORDER — AMLODIPINE BESYLATE 5 MG PO TABS: 5.0000 mg | ORAL_TABLET | Freq: Every day | ORAL | 0 refills | Status: DC

## 2020-09-13 NOTE — Telephone Encounter (Signed)
Please let patient know that I have changed her lisinopril to amlodipine. I have already sent it to the pharmacy for her. I would like to see her in 1 month to make sure her blood pressure is well controlled.

## 2020-09-13 NOTE — Patient Instructions (Signed)
START BREZTRI INHALER THERAPY  OBTAIN CXR OBTAIN PFT's   CONTINUE PROTONIX FOLLOW UP ENT FOR SINUS PROBLEMS

## 2020-09-13 NOTE — Telephone Encounter (Signed)
Dr. Mortimer Fries saw Krista Phelps in the office today for a chronic cough and recommends discontinuing the Lisinopril.  Thank you, Nira Conn

## 2020-09-13 NOTE — Progress Notes (Signed)
[facility] Consultation      Date: 09/13/2020,   MRN# [id number] KRISHAUNA SCHATZMAN 1955-05-16 Code Status:  Code Status History     Date Active Date Inactive Code Status Order ID Comments User Context   04/02/2018 [id number] 04/05/2018 1800 Full Code [id number]  Hessie Knows, MD Inpatient      Questions for Most Recent Historical Code Status (Order [id number])          Admission                  Current  KEYLEIGH MANNINEN is a 65 y.o. old female seen in consultation for cough  at the request of Mathis Dad.     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 She has 11 cats in home   No exacerbation at this time No evidence of heart failure at this time No evidence or signs of infection at this time No respiratory distress No fevers, chills, nausea, vomiting, diarrhea No evidence of lower extremity edema No evidence hemoptysis  I have explained to the patient with her signs and symptoms she may have chronic bronchitis but will need further testing and evaluation by based on her clinical history of chronic productive cough for several years likely diagnosis she has chronic bronchitis and will start inhaler therapy and will need education     PAST MEDICAL HISTORY   Past Medical History:  Diagnosis Date   Depression    GERD (gastroesophageal reflux disease)    Night sweats    Osteoarthritis    Reflux    Sinus problem      SURGICAL  HISTORY   Past Surgical History:  Procedure Laterality Date   APPENDECTOMY     CHOLECYSTECTOMY     plantar wart removed     TONSILLECTOMY AND ADENOIDECTOMY     TOTAL HIP ARTHROPLASTY Left 04/02/2018   Procedure: TOTAL HIP ARTHROPLASTY ANTERIOR APPROACH;  Surgeon: Hessie Knows, MD;  Location: [facility] ORS;  Service: Orthopedics;  Laterality: Left;     FAMILY HISTORY   Family History  Problem Relation Age of Onset   Cancer Father        lung     SOCIAL HISTORY   Social History   Tobacco Use   Smoking status: Former    Types: Cigarettes    Quit date: 03/24/1983    Years since quitting: 37.5   Smokeless tobacco: Never  Vaping Use   Vaping Use: Never used  Substance Use Topics   Alcohol use: No   Drug use: No     MEDICATIONS    Home Medication:  Current Outpatient Rx   Order #: [id number] Class: Historical Med   Order #: [id number] Class: Historical Med   Order #: [id number] Class: Normal   Order #: [id number] Class: Historical Med   Order #: [id number] Class: Normal   Order #: [id number] Class: Normal   Order #: [id number] Class: Historical Med   Order #: [id number] Class: Normal   Order #:  [id number] Class: Historical Med   Order #: [id number] Class: Historical Med   Order #: [id number] Class: Normal   Order #: [id number] Class: Historical Med   Order #: [id number] Class: Historical Med   Order #: [id number] Class: Normal   Order #: [id number] Class: Normal    Current Medication:  Current Outpatient Medications:    Apple Cider Vinegar 500 MG TABS, Take 2,000 mg by mouth daily as needed (for cramps)., Disp: , Rfl:    aspirin 81 MG tablet, Take 81 mg by mouth daily., Disp: , Rfl:    buPROPion (WELLBUTRIN XL) 300 MG 24 hr tablet, Take 1 tablet (300 mg total) by mouth daily., Disp: 90 tablet, Rfl: 1   calcium carbonate (TUMS - DOSED IN MG ELEMENTAL CALCIUM) 500 MG chewable tablet, Chew 1 tablet by mouth daily as needed for indigestion or heartburn. , Disp: , Rfl:    estradiol (ESTRACE  VAGINAL) 0.1 MG/GM vaginal cream, Place 1 Applicatorful vaginally at bedtime as needed., Disp: 42.5 g, Rfl: 3   fluticasone (FLONASE) 50 MCG/ACT nasal spray, Place 2 sprays into both nostrils in the morning and at bedtime. (Patient not taking: No sig reported), Disp: 16 g, Rfl: 0   Homeopathic Products (LEG CRAMPS PO), Take 4 tablets by mouth daily., Disp: , Rfl:    lisinopril (ZESTRIL) 40 MG tablet, Take 1 tablet (40 mg total) by mouth daily., Disp: 90 tablet, Rfl: 0   Magnesium 250 MG TABS, Take 1,000 mg by mouth daily as needed (for cramps)., Disp: , Rfl:    Melatonin 10 MG TABS, Take 10 mg by mouth at bedtime. , Disp: , Rfl:    meloxicam (MOBIC) 15 MG tablet, Take 1 tablet (15 mg total) by mouth daily., Disp: 90 tablet, Rfl: 1   Multiple Vitamin (MULTIVITAMIN) tablet, Take 1 tablet by mouth daily., Disp: , Rfl:    Multiple Vitamins-Minerals (PRESERVISION AREDS 2 PO), Take 2 tablets by mouth daily., Disp: , Rfl:    pantoprazole (PROTONIX) 40 MG tablet, Take 1 capsule once to twice daily as needed, Disp: 45 tablet, Rfl: 5   sertraline (ZOLOFT) 100 MG tablet, Take 1.5 tablets (150 mg total) by mouth daily., Disp: 135 tablet, Rfl: 1    ALLERGIES   Atropine     REVIEW OF SYSTEMS    Review of Systems:  Gen:  Denies  fever, sweats, chills weigh loss  HEENT: Denies blurred vision, double vision, ear pain, eye pain, hearing loss, nose bleeds, sore throat Cardiac:  No dizziness, chest pain or heaviness, chest tightness,edema Resp:   +cough , -shortness of breath,wheezing, hemoptysis,  Gi: Denies swallowing difficulty, stomach pain, nausea or vomiting, diarrhea, constipation, bowel incontinence Gu:  Denies bladder incontinence, burning urine Ext:   Denies Joint pain, stiffness or swelling Skin: Denies  skin rash, easy bruising or bleeding or hives Endoc:  Denies polyuria, polydipsia , polyphagia or weight change Psych:   Denies depression, insomnia or hallucinations   Other:  All other  systems negative BP 110/60 (BP Location: Left Arm, Patient Position: Sitting, Cuff Size: Large)   Pulse (!) 107   Temp 98.2 F (36.8 C) (Oral)   Ht 5' 2.5" (1.588 m)   Wt 148 lb (67.1 kg)   SpO2 98%   BMI 26.64 kg/m      Physical Examination:   General Appearance: No distress  EYES PERRLA, EOM intact.   NECK Supple, No JVD Pulmonary: normal breath sounds, No wheezing.  CardiovascularNormal S1,S2.  No m/r/g.   Abdomen: Benign, Soft, non-tender. Skin:  warm, no rashes, no ecchymosis  Extremities: normal, no cyanosis, clubbing. Neuro:without focal findings,  speech normal  PSYCHIATRIC: Mood, affect within normal limits.   ALL OTHER ROS ARE NEGATIVE        ASSESSMENT/PLAN   65 year old pleasant white female seen today for assessment for chronic cough  Patient has signs and symptoms of chronic productive cough for many years progressively worsening over the last 6 to 8 months and symptoms related to COPD with chronic bronchitis with underlying diagnosis of GERD with chronic sinus issues  Chronic bronchitis Obtain pulmonary function testing Start Breztri inhaler Education provided Avoid allergens   History of sinus issues Follow-up ENT  GERD Continue PPI  Shortness of breath and congestion Obtain chest x-ray Obtain pulmonary function test to assess lung function        MEDICATION ADJUSTMENTS/LABS AND TESTS ORDERED: START BREZTRI INHALER THERAPY  OBTAIN CXR OBTAIN PFT's   CONTINUE PROTONIX FOLLOW UP ENT FOR SINUS PROBLEMS   CURRENT MEDICATIONS REVIEWED AT LENGTH WITH PATIENT TODAY   Patient  satisfied with Plan of action and management. All questions answered  Follow up 3 months  Total Time Spent  47 mins   Corrin Parker, M.D.  [facility]  Medical Director [place] Director [facility] Cardio-Pulmonary Department

## 2020-09-13 NOTE — Telephone Encounter (Signed)
Patient aware of provider sending in rx. Patient has apt scheduled for a month.

## 2020-09-13 NOTE — Addendum Note (Signed)
Addended by: Vanessa Barbara on: 09/13/2020 10:52 AM   Modules accepted: Orders

## 2020-10-25 ENCOUNTER — Other Ambulatory Visit: Payer: Self-pay

## 2020-10-25 ENCOUNTER — Encounter: Payer: Self-pay | Admitting: Nurse Practitioner

## 2020-10-25 ENCOUNTER — Ambulatory Visit (INDEPENDENT_AMBULATORY_CARE_PROVIDER_SITE_OTHER): Payer: Commercial Managed Care - PPO | Admitting: Nurse Practitioner

## 2020-10-25 VITALS — BP 168/77 | HR 71 | Temp 98.4°F | Ht 62.8 in | Wt 149.5 lb

## 2020-10-25 DIAGNOSIS — I10 Essential (primary) hypertension: Secondary | ICD-10-CM | POA: Diagnosis not present

## 2020-10-25 DIAGNOSIS — R059 Cough, unspecified: Secondary | ICD-10-CM | POA: Diagnosis not present

## 2020-10-25 MED ORDER — AMLODIPINE BESYLATE 5 MG PO TABS: 5.0000 mg | ORAL_TABLET | Freq: Every day | ORAL | 1 refills | Status: DC

## 2020-10-25 NOTE — Assessment & Plan Note (Signed)
Chronic.  Ongoing. Patient was out medication. However, when she was taking the medication regularly her blood pressure was well controlled. Refill sent today.  Follow up in 3 months for reevaluation.

## 2020-10-25 NOTE — Progress Notes (Signed)
BP (!) 168/77   Pulse 71   Temp 98.4 F (36.9 C)   Ht 5' 2.8" (1.595 m)   Wt 149 lb 8 oz (67.8 kg)   SpO2 95%   BMI 26.66 kg/m    Subjective:    Patient ID: Krista Phelps, female    DOB: 1955-12-06, 65 y.o.   MRN: [id number]  HPI: Krista Phelps is a 65 y.o. female  Chief Complaint  Patient presents with   Hypertension   Cough   HYPERTENSION Patient has been out of medication for about a week.  Hypertension status: controlled  Satisfied with current treatment? no Duration of hypertension: years BP monitoring frequency:  daily BP range: 130/80 BP medication side effects:  no Medication compliance: excellent compliance Previous BP meds:amlodipine Aspirin: no Recurrent headaches: no Visual changes: no Palpitations: no Dyspnea: no Chest pain: no Lower extremity edema: no Dizzy/lightheaded: no  COUGH Patient states her cough has gotten better.  She hasn't really changed anything but it has definitely improved.   Relevant past medical, surgical, family and social history reviewed and updated as indicated. Interim medical history since our last visit reviewed. Allergies and medications reviewed and updated.  Review of Systems  Eyes:  Negative for visual disturbance.  Respiratory:  Positive for cough. Negative for chest tightness and shortness of breath.   Cardiovascular:  Negative for chest pain, palpitations and leg swelling.  Neurological:  Negative for dizziness and headaches.   Per HPI unless specifically indicated above     Objective:    BP (!) 168/77   Pulse 71   Temp 98.4 F (36.9 C)   Ht 5' 2.8" (1.595 m)   Wt 149 lb 8 oz (67.8 kg)   SpO2 95%   BMI 26.66 kg/m   Wt Readings from Last 3 Encounters:  10/25/20 149 lb 8 oz (67.8 kg)  09/13/20 148 lb (67.1 kg)  08/24/20 149 lb 12.8 oz (67.9 kg)    Physical Exam Vitals and nursing note reviewed.  Constitutional:      General: She is not in acute distress.    Appearance: Normal appearance. She  is normal weight. She is not ill-appearing, toxic-appearing or diaphoretic.  HENT:     Head: Normocephalic.     Right Ear: External ear normal.     Left Ear: External ear normal.     Nose: Nose normal.     Mouth/Throat:     Mouth: Mucous membranes are moist.     Pharynx: Oropharynx is clear.  Eyes:     General:        Right eye: No discharge.        Left eye: No discharge.     Extraocular Movements: Extraocular movements intact.     Conjunctiva/sclera: Conjunctivae normal.     Pupils: Pupils are equal, round, and reactive to light.  Cardiovascular:     Rate and Rhythm: Normal rate and regular rhythm.     Heart sounds: No murmur heard. Pulmonary:     Effort: Pulmonary effort is normal. No respiratory distress.     Breath sounds: Normal breath sounds. No wheezing or rales.  Musculoskeletal:     Cervical back: Normal range of motion and neck supple.  Skin:    General: Skin is warm and dry.     Capillary Refill: Capillary refill takes less than 2 seconds.  Neurological:     General: No focal deficit present.     Mental Status: She is alert and oriented  to person, place, and time. Mental status is at baseline.  Psychiatric:        Mood and Affect: Mood normal.        Behavior: Behavior normal.        Thought Content: Thought content normal.        Judgment: Judgment normal.    Results for orders placed or performed in visit on 06/23/20  Comprehensive metabolic panel  Result Value Ref Range   Glucose 78 65 - 99 mg/dL   BUN 18 8 - 27 mg/dL   Creatinine, Ser 0.94 0.57 - 1.00 mg/dL   eGFR 67 >59 mL/min/1.73   BUN/Creatinine Ratio 19 12 - 28   Sodium 141 134 - 144 mmol/L   Potassium 4.8 3.5 - 5.2 mmol/L   Chloride 100 96 - 106 mmol/L   CO2 24 20 - 29 mmol/L   Calcium 9.8 8.7 - 10.3 mg/dL   Total Protein 6.5 6.0 - 8.5 g/dL   Albumin 4.8 3.8 - 4.8 g/dL   Globulin, Total 1.7 1.5 - 4.5 g/dL   Albumin/Globulin Ratio 2.8 (H) 1.2 - 2.2   Bilirubin Total 0.3 0.0 - 1.2 mg/dL    Alkaline Phosphatase 66 44 - 121 IU/L   AST 16 0 - 40 IU/L   ALT 14 0 - 32 IU/L  Lipid panel  Result Value Ref Range   Cholesterol, Total 297 (H) 100 - 199 mg/dL   Triglycerides 91 0 - 149 mg/dL   HDL 88 >39 mg/dL   VLDL Cholesterol Cal 15 5 - 40 mg/dL   LDL Chol Calc (NIH) 194 (H) 0 - 99 mg/dL   Chol/HDL Ratio 3.4 0.0 - 4.4 ratio  Hepatitis C Antibody  Result Value Ref Range   Hep C Virus Ab <0.1 0.0 - 0.9 s/co ratio      Assessment & Plan:   Problem List Items Addressed This Visit       Cardiovascular and Mediastinum   Hypertension - Primary    Chronic.  Ongoing. Patient was out medication. However, when she was taking the medication regularly her blood pressure was well controlled. Refill sent today.  Follow up in 3 months for reevaluation.       Relevant Medications   amLODipine (NORVASC) 5 MG tablet   Other Visit Diagnoses     Cough       Improved.  Continue to follow up with Pulmonology.        Follow up plan: Return in about 3 months (around 01/25/2021) for HTN, HLD, DM2 FU.

## 2020-10-27 ENCOUNTER — Other Ambulatory Visit: Payer: Self-pay | Admitting: Otolaryngology

## 2020-10-27 DIAGNOSIS — H9391 Unspecified disorder of right ear: Secondary | ICD-10-CM

## 2020-11-11 ENCOUNTER — Ambulatory Visit
Admission: RE | Admit: 2020-11-11 | Discharge: 2020-11-11 | Disposition: A | Discharge status: Home / Self Care | Payer: Commercial Managed Care - PPO | Source: Ambulatory Visit | Attending: Otolaryngology | Admitting: Otolaryngology

## 2020-11-11 ENCOUNTER — Other Ambulatory Visit: Payer: Self-pay

## 2020-11-11 DIAGNOSIS — H9391 Unspecified disorder of right ear: Secondary | ICD-10-CM | POA: Diagnosis not present

## 2020-11-11 IMAGING — US US CAROTID DUPLEX BILAT
1 series · 13 of 24 positions shown · non-contrast
Comparison: None.

CLINICAL DATA: Disorder of right ear

EXAM:
BILATERAL CAROTID DUPLEX ULTRASOUND
TECHNIQUE: Gray scale imaging, color Doppler and duplex ultrasound were
performed of bilateral carotid and vertebral arteries in the neck.

[Series 1: us carotid bilateral · 13 of 65 slices shown]
[im 1/65]
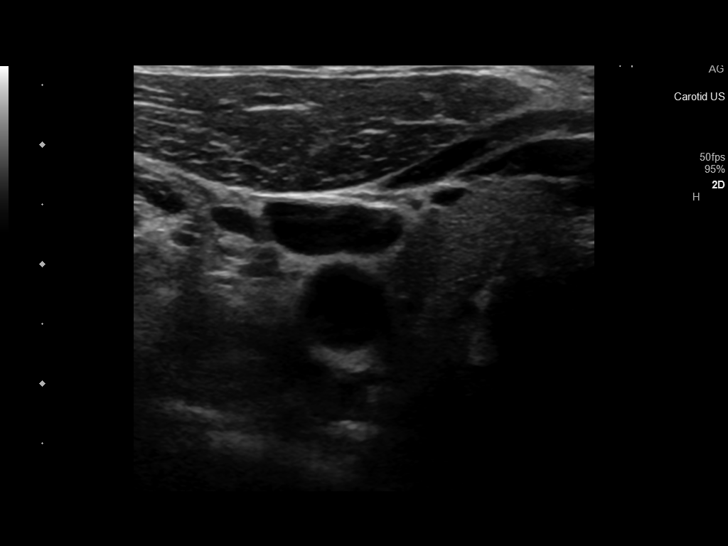
[im 6/65]
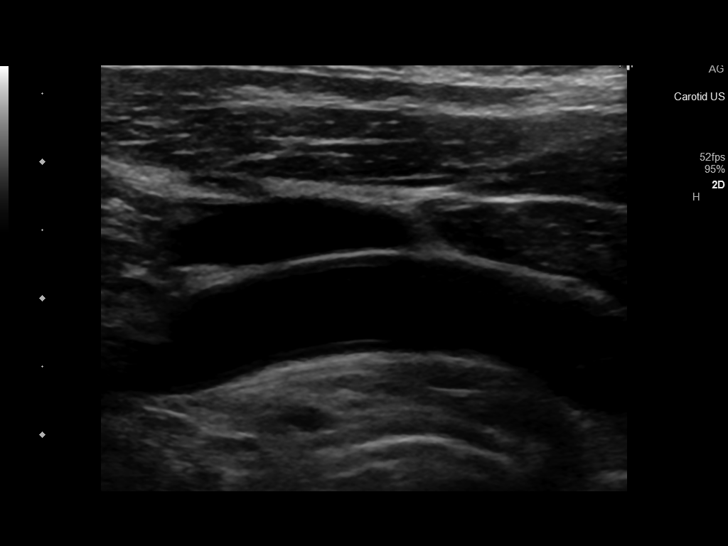
[im 12/65]
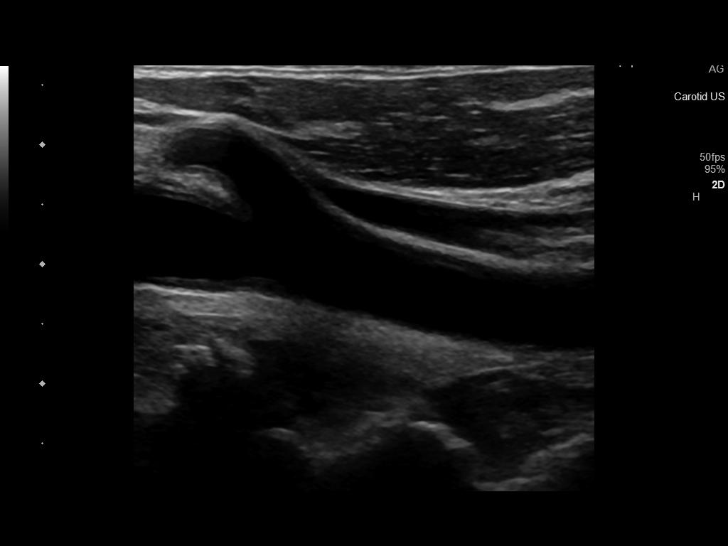
[im 17/65]
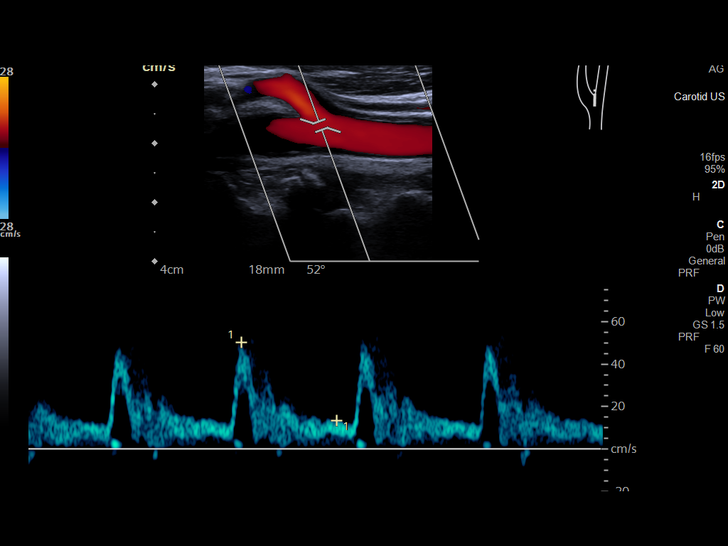
[im 23/65]
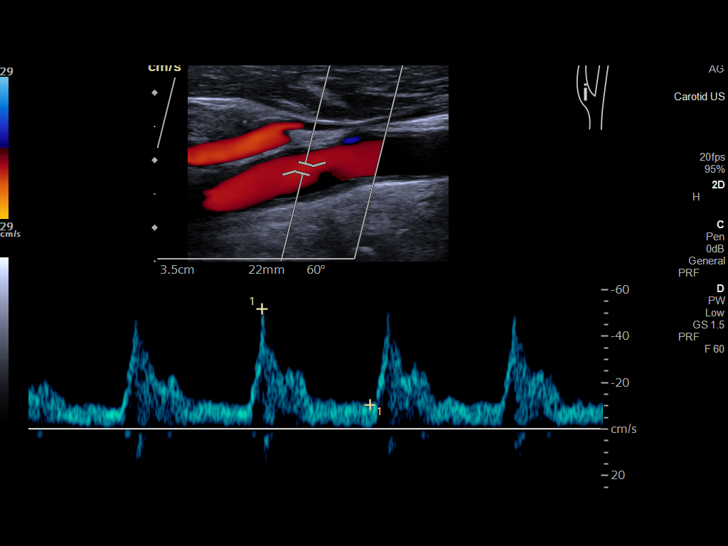
[im 28/65]
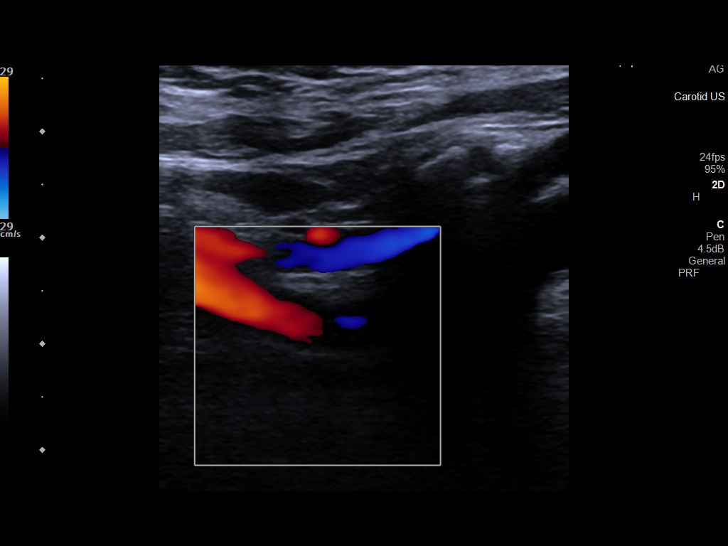
[im 34/65]
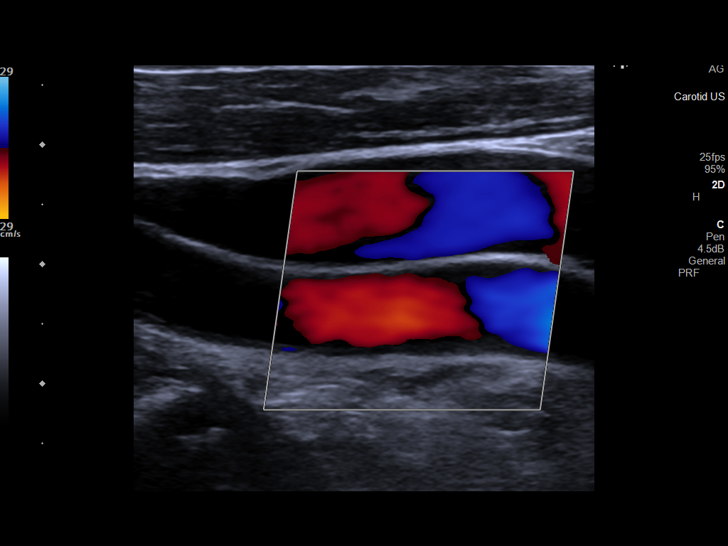
[im 37/65]
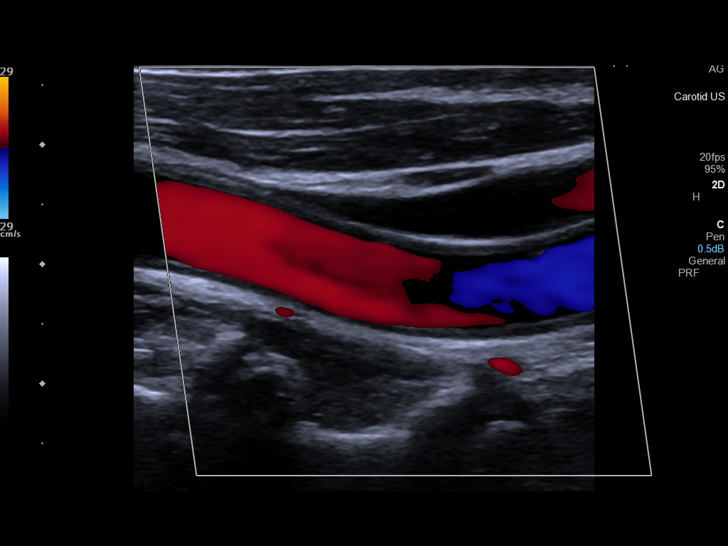
[im 42/65]
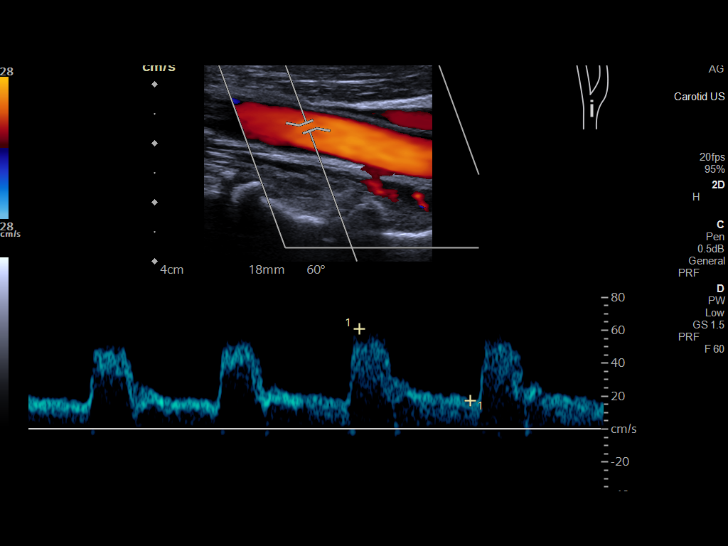
[im 48/65]
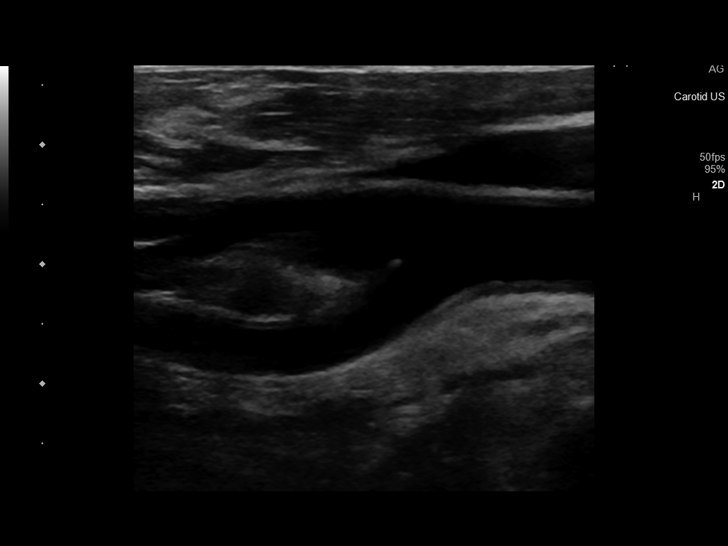
[im 53/65]
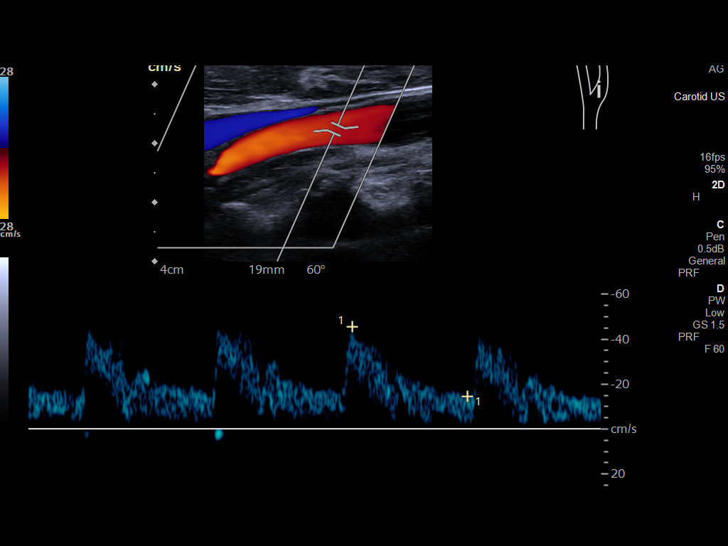
[im 59/65]
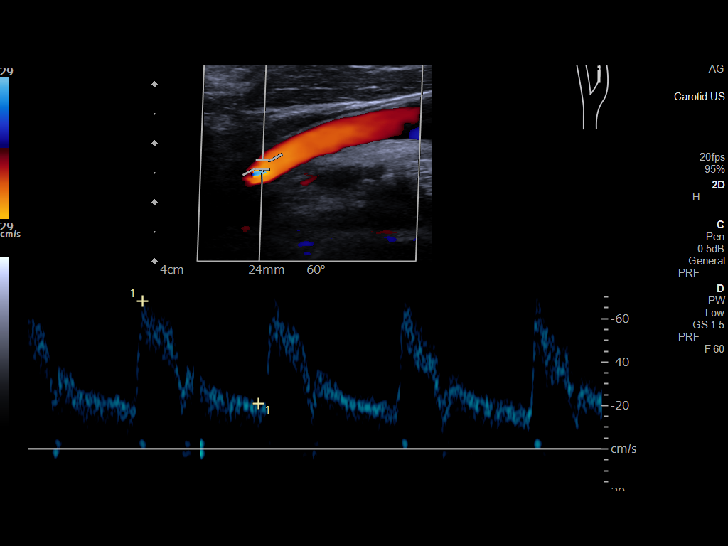
[im 65/65]
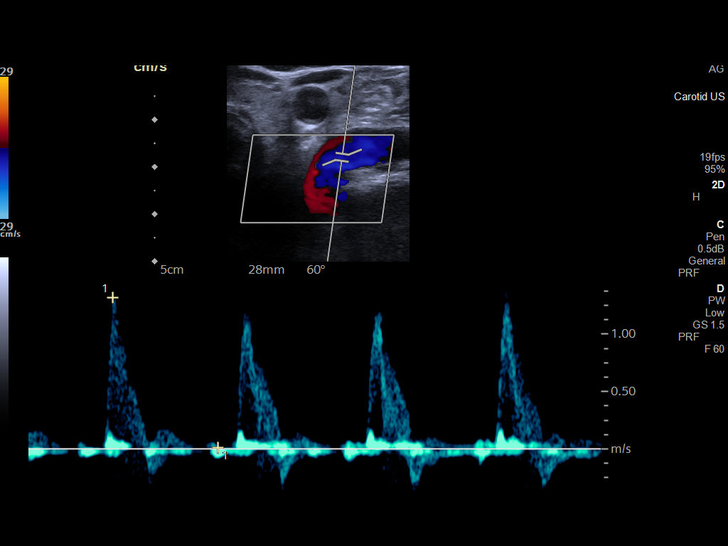

[13 of 24 positions shown; findings below may reference images not displayed]

FINDINGS: Criteria: Quantification of carotid stenosis is based on velocity
parameters that correlate the residual internal carotid diameter
with NASCET-based stenosis levels, using the diameter of the distal
internal carotid lumen as the denominator for stenosis measurement.

The following velocity measurements were obtained:

RIGHT

ICA: 59/15 cm/sec

CCA: 70/16 cm/sec

SYSTOLIC ICA/CCA RATIO:

ECA: 77 cm/sec

LEFT

ICA: 68/21 cm/sec

CCA: 61/17 cm/sec

SYSTOLIC ICA/CCA RATIO:

ECA: 88 cm/sec

RIGHT CAROTID ARTERY: No significant atherosclerotic plaque

RIGHT VERTEBRAL ARTERY:  Antegrade flow

LEFT CAROTID ARTERY:  No significant atherosclerotic plaque

LEFT VERTEBRAL ARTERY:  Antegrade flow
IMPRESSION: No evidence of hemodynamically significant stenosis involving either
the right or left carotid circulation in the neck by Doppler
criteria. No significant atherosclerotic plaque appreciated in the
bilateral carotid arteries.

## 2020-11-11 IMAGING — MR MR MRA HEAD W/O CM
1 series · 18 of 48 positions shown · IV contrast (gadavist)
Comparison: No pertinent prior exam.

CLINICAL DATA: Disorder of right ear.

EXAM:
MRI HEAD WITHOUT AND WITH CONTRAST
MRA HEAD WITHOUT CONTRAST
TECHNIQUE: Multiplanar, multi-echo pulse sequences of the brain and surrounding
structures were acquired without and with intravenous contrast.
Angiographic images of the Circle of Willis were acquired using MRA
technique without intravenous contrast.
CONTRAST:  6mL GADAVIST GADOBUTROL 1 MMOL/ML IV SOLN

[Series 5: TOF · axial · 0.5mm · 0.41mm/px · z∈[-78,+18]mm · 18 of 205 slices shown]
[im 1/205]
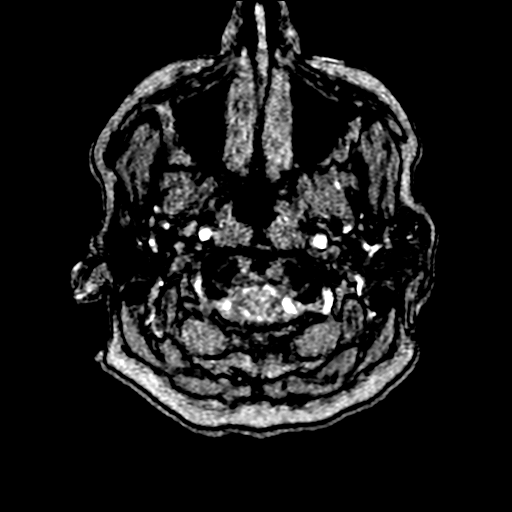
[im 5/205]
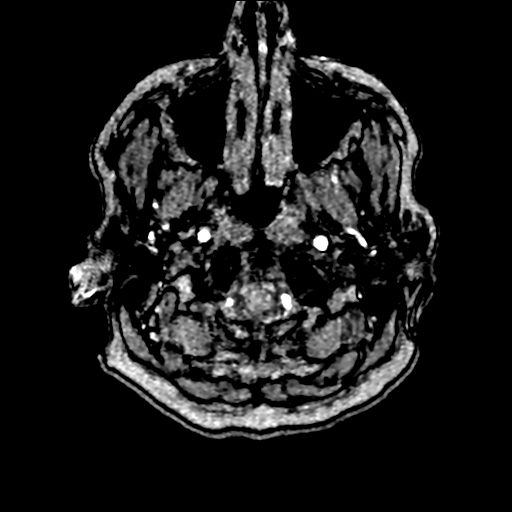
[im 9/205]
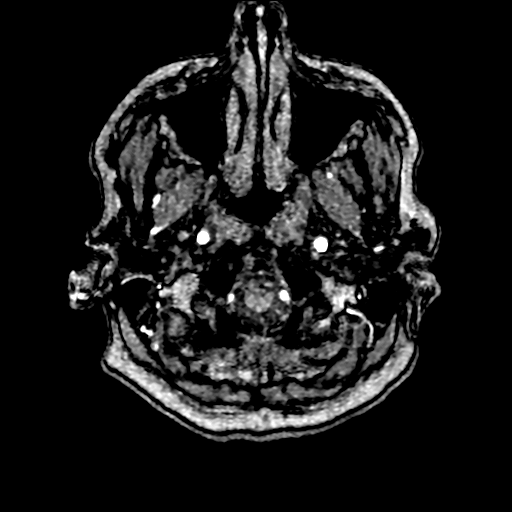
[im 14/205]
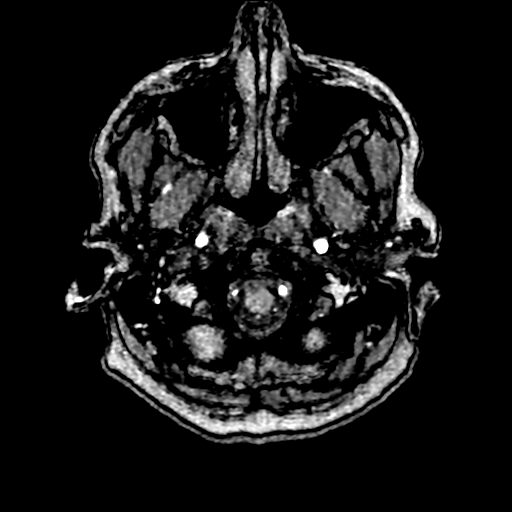
[im 18/205]
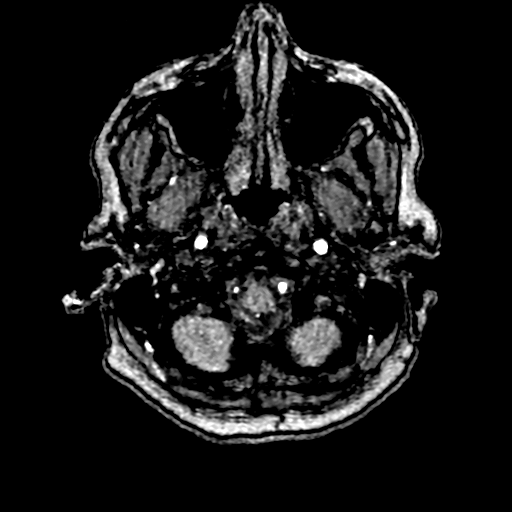
[im 22/205]
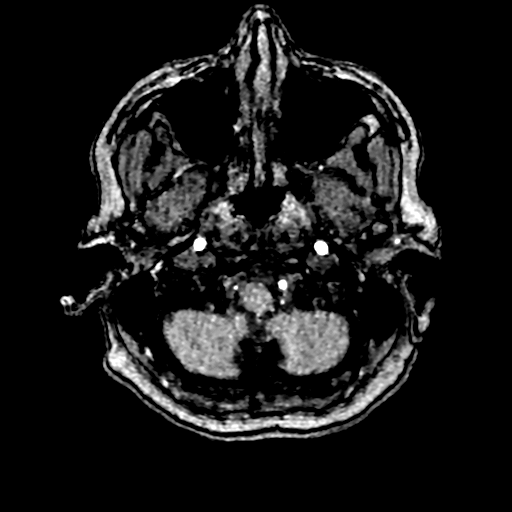
[im 27/205]
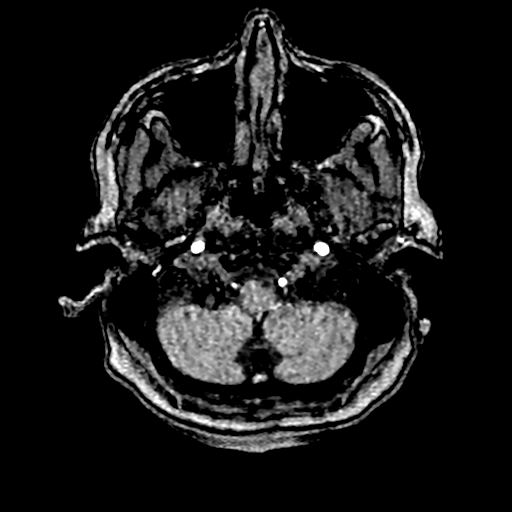
[im 31/205]
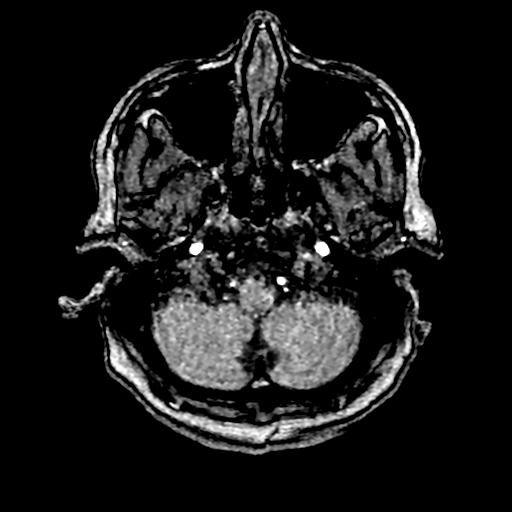
[im 35/205]
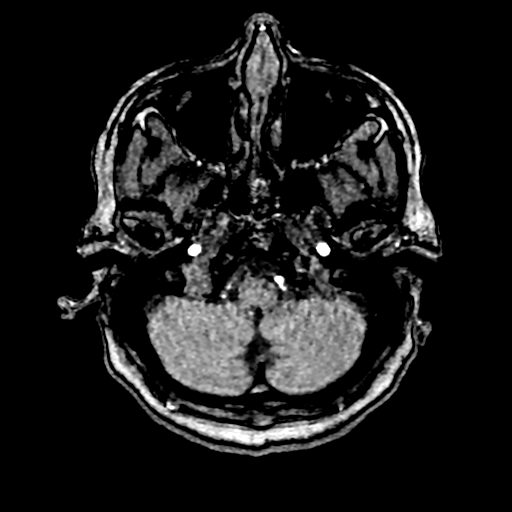
[im 40/205]
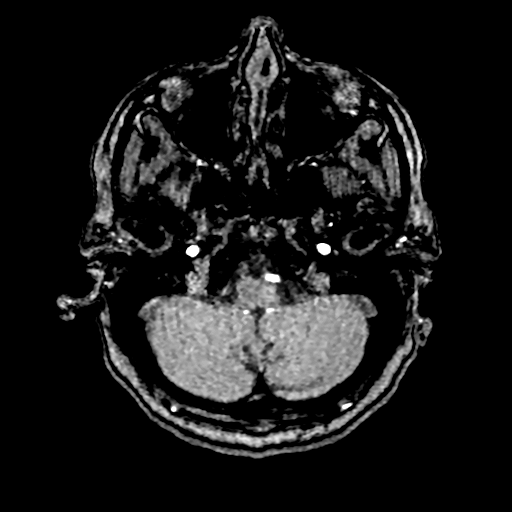
[im 66/205]
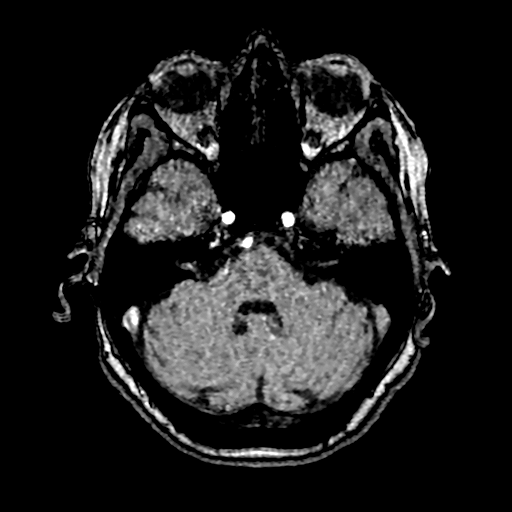
[im 92/205]
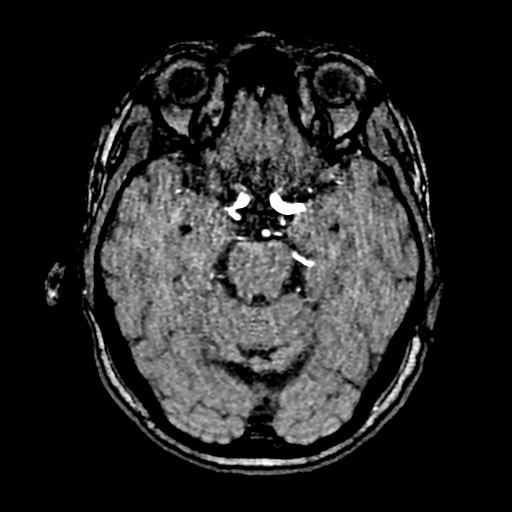
[im 105/205]
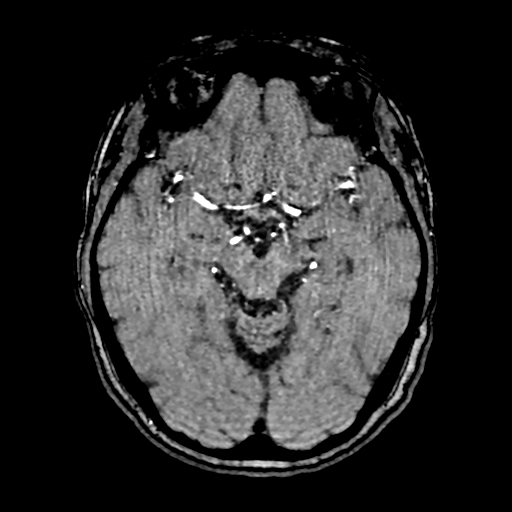
[im 118/205]
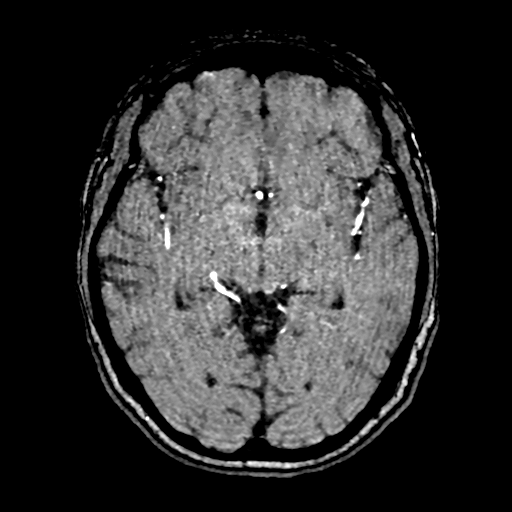
[im 144/205]
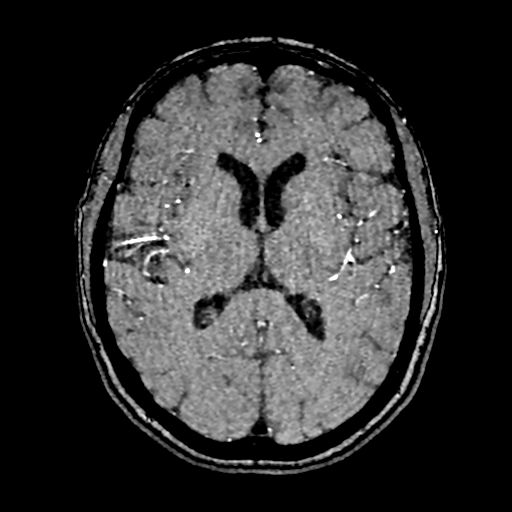
[im 170/205]
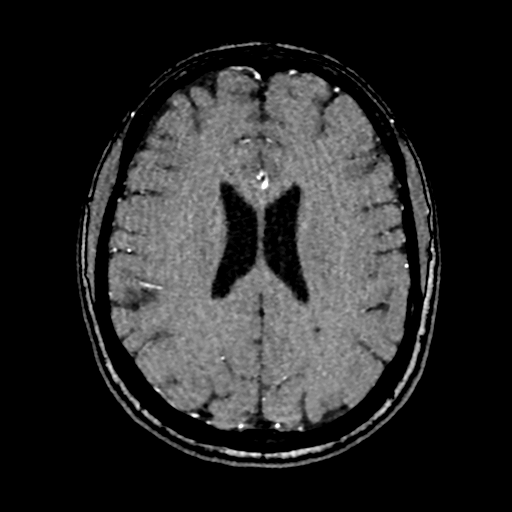
[im 174/205]
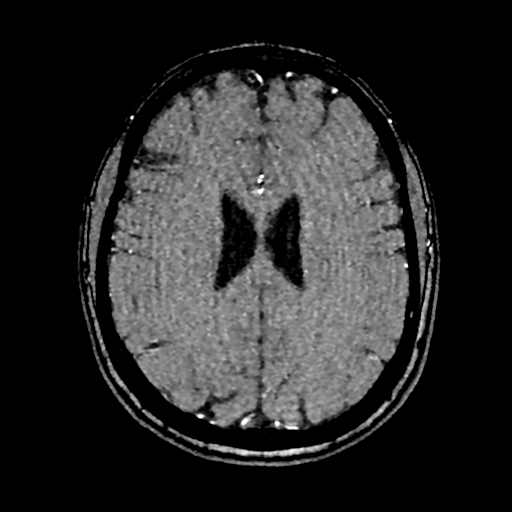
[im 196/205]
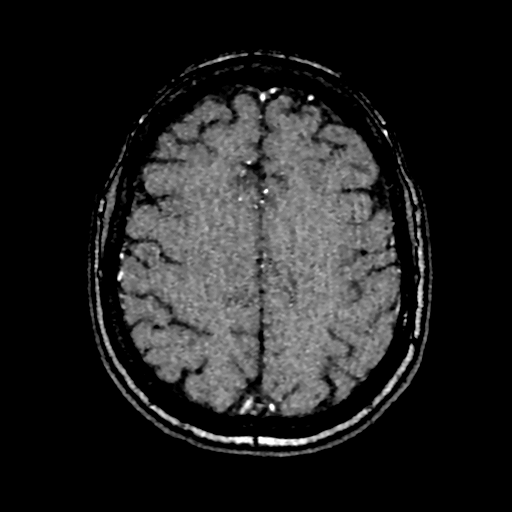

[18 of 48 positions shown; findings below may reference images not displayed]

FINDINGS: MRI HEAD FINDINGS

Brain: No acute infarction, hemorrhage, hydrocephalus, or
extra-axial collection. No pathologic intracranial enhancement.

Small dural-based extra-axial lesion in the olfactory groove
measuring 1.8 x 1.8 x 1.4 cm, causing mild mass effect on the
bilateral gyrus rectus, right greater than left without evidence of
edema. Scattered foci of T2 hyperintensity are seen within the white
of the cerebral hemispheres, nonspecific.

No cerebellopontine angle mass or internal auditory canal lesion is
demonstrated.Normal appearance of the 7th and 8th cranial nerves
bilaterally.

Vascular: Normal flow voids.

Skull and upper cervical spine: Normal marrow signal.

Sinuses/Orbits: No acute or significant finding.

Other: None.

MRA HEAD FINDINGS

The study is partially degraded by motion.

Anterior circulation: The visualized portions of the distal cervical
and intracranial internal carotid arteries are widely patent with
normal flow related enhancement. Short segment of moderate stenosis
in right A3/ACA segment. The left anterior cerebral arteries and
bilateral middle cerebral arteries are widely patent with antegrade
flow without high-grade flow-limiting stenosis or proximal branch
occlusion. No intracranial aneurysm within the anterior circulation.

Posterior circulation: The vertebral arteries are widely patent with
antegrade flow. Vertebrobasilar junction and basilar artery are
widely patent with antegrade flow without evidence of basilar
stenosis or aneurysm. Posterior cerebral arteries are normal
bilaterally. No intracranial aneurysm within the posterior
circulation.

Anatomic variants: Hypoplastic right A1/ACA segment. Hypoplastic
bilateral P1/PCA segments with prominent posterior communicating
artery bilaterally. Hypoplastic right vertebral artery ending in
PICA.
IMPRESSION: 1. An olfactory groove meningioma measuring up to 1.8 cm causing
mild mass effect on the inferior bilateral frontal lobes without
evidence of parenchymal edema.
2. No internal auditory canal or cerebellopontine angle lesion or
focus of abnormal contrast.
3. Mild-to-moderate amount of nonspecific T2 hyperintense lesions of
the white matter, may represent chronic microvascular ischemic
changes.
4. Short segment of moderate stenosis in right A3/ACA segment. No
other significant intracranial vascular abnormality noted.

## 2020-11-11 MED ORDER — GADOBUTROL 1 MMOL/ML IV SOLN
6.0000 mL | Freq: Once | INTRAVENOUS | Status: AC | PRN
  Administered 2020-11-11: 6 mL via INTRAVENOUS

## 2020-11-15 ENCOUNTER — Other Ambulatory Visit: Payer: Self-pay | Admitting: Nurse Practitioner

## 2020-11-15 MED ORDER — PANTOPRAZOLE SODIUM 40 MG PO TBEC: DELAYED_RELEASE_TABLET | ORAL | 2 refills | Status: DC

## 2020-11-15 NOTE — Telephone Encounter (Signed)
Medication Refill - Medication: pantoprazole (PROTONIX) 40 MG tablet  Has the patient contacted their pharmacy? Yes.   (Agent: If no, request that the patient contact the pharmacy for the refill.) (Agent: If yes, when and what did the pharmacy advise?)  Preferred Pharmacy (with phone number or street name):  [place] (N), [place] - [address]  [place] [address]  Phone: [phone] Fax: [phone]   Has the patient been seen for an appointment in the last year OR does the patient have an upcoming appointment? Yes.    Agent: Please be advised that RX refills may take up to 3 business days. We ask that you follow-up with your pharmacy.

## 2020-12-13 ENCOUNTER — Telehealth: Payer: Self-pay

## 2020-12-13 NOTE — Telephone Encounter (Signed)
Lm for reminder of covid test prior to PFT.  12[facility]15/[facility] 

## 2020-12-14 NOTE — Telephone Encounter (Signed)
Called and LVM x2 in regards to upcoming covid test on 10/19.

## 2020-12-14 NOTE — Telephone Encounter (Signed)
Attempted to call pt but unable to reach. Left pt a detailed message about upcoming covid test that needs to be done 10/19. Nothing further needed.

## 2020-12-15 ENCOUNTER — Other Ambulatory Visit: Payer: Self-pay

## 2020-12-15 ENCOUNTER — Other Ambulatory Visit
Admission: RE | Admit: 2020-12-15 | Discharge: 2020-12-15 | Disposition: A | Discharge status: Home / Self Care | Payer: Commercial Managed Care - PPO | Source: Ambulatory Visit | Attending: Internal Medicine | Admitting: Internal Medicine

## 2020-12-15 DIAGNOSIS — Z01812 Encounter for preprocedural laboratory examination: Secondary | ICD-10-CM | POA: Diagnosis not present

## 2020-12-15 DIAGNOSIS — Z20822 Contact with and (suspected) exposure to covid-19: Secondary | ICD-10-CM

## 2020-12-15 DIAGNOSIS — U071 COVID-19: Secondary | ICD-10-CM | POA: Diagnosis not present

## 2020-12-15 LAB — SARS CORONAVIRUS 2 (TAT 6-24 HRS): SARS Coronavirus 2: POSITIVE — AB

## 2020-12-16 ENCOUNTER — Telehealth: Payer: Self-pay

## 2020-12-16 ENCOUNTER — Ambulatory Visit: Payer: Commercial Managed Care - PPO

## 2020-12-16 NOTE — Telephone Encounter (Signed)
Please see 12/16/2020 phone note.

## 2020-12-16 NOTE — Telephone Encounter (Signed)
Patient tested positive for covid.  PFT will need to be rescheduled.   Rodena Piety, please reschedule PFT.   Dr. Mortimer Fries, please see results. Thanks

## 2020-12-16 NOTE — Telephone Encounter (Signed)
Spoke to patient and relayed below message. She voiced her understanding and had no further questions.  PFT and OV has been canceled.   Rodena Piety, please reschedule PFT.  Please let me know once PFT has been scheduled so that I can contact patient to schedule OV.

## 2020-12-16 NOTE — Telephone Encounter (Signed)
Lm for patient.  

## 2020-12-20 ENCOUNTER — Ambulatory Visit: Payer: Commercial Managed Care - PPO | Admitting: Internal Medicine

## 2020-12-24 NOTE — Telephone Encounter (Signed)
PFT has been rescheduled for  01/18/21

## 2020-12-24 NOTE — Telephone Encounter (Signed)
Ov scheduled 02/02/2021 at 10:00. Patient is aware and voiced her understanding.  Nothing further needed at this time.

## 2020-12-25 ENCOUNTER — Other Ambulatory Visit: Payer: Self-pay | Admitting: Nurse Practitioner

## 2020-12-25 NOTE — Telephone Encounter (Signed)
Requested Prescriptions  Pending Prescriptions Disp Refills  . sertraline (ZOLOFT) 100 MG tablet [Pharmacy Med Name: Sertraline HCl 100 MG Oral Tablet] 45 tablet 0    Sig: TAKE 1 & 1/2 (ONE & ONE-HALF) TABLETS BY MOUTH ONCE DAILY     Psychiatry:  Antidepressants - SSRI Passed - 12/25/2020  8:47 AM      Passed - Completed PHQ-2 or PHQ-9 in the last 360 days      Passed - Valid encounter within last 6 months    Recent Outpatient Visits          2 months ago Primary hypertension   [facility] Jon Billings, NP   4 months ago Cough   [facility] Jon Billings, NP   5 months ago Depression, recurrent [facility])   [facility] Jon Billings, NP   6 months ago Depression, recurrent [facility])   [facility] Jon Billings, NP   11 months ago Depression, recurrent [facility])   [place], [place], DO      Future Appointments            In 1 month Jon Billings, [place], [place]   In 1 month Flora Lipps, MD [place]

## 2021-01-12 ENCOUNTER — Other Ambulatory Visit: Payer: Commercial Managed Care - PPO

## 2021-01-13 ENCOUNTER — Telehealth: Payer: Self-pay

## 2021-01-13 NOTE — Telephone Encounter (Signed)
Noted.  Will close encounter.  

## 2021-01-13 NOTE — Telephone Encounter (Signed)
Called and LVM in regards to covid test, on 11/21 will try again later.

## 2021-01-17 ENCOUNTER — Other Ambulatory Visit: Payer: Self-pay

## 2021-01-17 ENCOUNTER — Other Ambulatory Visit
Admission: RE | Admit: 2021-01-17 | Discharge: 2021-01-17 | Disposition: A | Discharge status: Home / Self Care | Payer: Commercial Managed Care - PPO | Source: Ambulatory Visit | Attending: Internal Medicine | Admitting: Internal Medicine

## 2021-01-17 DIAGNOSIS — Z20822 Contact with and (suspected) exposure to covid-19: Secondary | ICD-10-CM | POA: Insufficient documentation

## 2021-01-17 DIAGNOSIS — Z01812 Encounter for preprocedural laboratory examination: Secondary | ICD-10-CM | POA: Diagnosis present

## 2021-01-18 ENCOUNTER — Ambulatory Visit: Payer: Commercial Managed Care - PPO | Attending: Internal Medicine

## 2021-01-18 DIAGNOSIS — R059 Cough, unspecified: Secondary | ICD-10-CM | POA: Diagnosis not present

## 2021-01-18 DIAGNOSIS — Z87891 Personal history of nicotine dependence: Secondary | ICD-10-CM | POA: Insufficient documentation

## 2021-01-18 DIAGNOSIS — J411 Mucopurulent chronic bronchitis: Secondary | ICD-10-CM | POA: Diagnosis present

## 2021-01-18 LAB — SARS CORONAVIRUS 2 (TAT 6-24 HRS): SARS Coronavirus 2: NEGATIVE

## 2021-01-18 MED ORDER — ALBUTEROL SULFATE (2.5 MG/3ML) 0.083% IN NEBU
2.5000 mg | INHALATION_SOLUTION | Freq: Once | RESPIRATORY_TRACT | Status: AC
  Administered 2021-01-18: 2.5 mg via RESPIRATORY_TRACT
  Filled 2021-01-18: qty 3

## 2021-01-22 ENCOUNTER — Other Ambulatory Visit: Payer: Self-pay | Admitting: Nurse Practitioner

## 2021-01-23 NOTE — Telephone Encounter (Signed)
Requested Prescriptions  Pending Prescriptions Disp Refills  . sertraline (ZOLOFT) 100 MG tablet [Pharmacy Med Name: Sertraline HCl 100 MG Oral Tablet] 45 tablet 0    Sig: TAKE 1 & 1/2 (ONE & ONE-HALF) TABLETS BY MOUTH ONCE DAILY     Psychiatry:  Antidepressants - SSRI Passed - 01/22/2021  9:12 AM      Passed - Completed PHQ-2 or PHQ-9 in the last 360 days      Passed - Valid encounter within last 6 months    Recent Outpatient Visits          3 months ago Primary hypertension   [facility] Jon Billings, NP   5 months ago Cough   [facility] Jon Billings, NP   6 months ago Depression, recurrent [facility])   [facility] Jon Billings, NP   7 months ago Depression, recurrent ([place])   [facility] Jon Billings, NP   1 year ago Depression, recurrent [facility])   [place], [place], DO      Future Appointments            In 3 days Jon Billings, NP [facility], [place]   In 1 week Flora Lipps, MD [place]

## 2021-01-25 NOTE — Progress Notes (Signed)
 BP 125/75   Pulse 80   Temp 97.6 F (36.4 C) (Oral)   Ht 5' 2.5" (1.588 m)   Wt 150 lb (68 kg)   SpO2 98%   BMI 27.00 kg/m    Subjective:    Patient ID: Krista Phelps, female    DOB: 11/15/1955, 65 y.o.   MRN: [id number]  HPI: Krista Phelps is a 65 y.o. female  Chief Complaint  Patient presents with   Diabetes   Hyperlipidemia   Hypertension   HYPERTENSION Patient has been out of medication for about a week.  Hypertension status: controlled  Satisfied with current treatment? no Duration of hypertension: years BP monitoring frequency:  daily BP range: 125/65 BP medication side effects:  no Medication compliance: excellent compliance Previous BP meds:amlodipine Aspirin: no Recurrent headaches: no Visual changes: no Palpitations: no Dyspnea: no Chest pain: no Lower extremity edema: no Dizzy/lightheaded: no  COUGH Patient states her cough is about the same.  She states it has leveled out and not bothering her like it was.  She is still seeing Pulmonology.  DEPRESSION Patient states she feels like her depression it is not too bad.  States that if something bothers her she cries frequently and does have a tendency to get irritable. She is happy with her regimen.  Denies SI.  Flowsheet Row Office Visit from 01/26/2021 in [facility]  PHQ-9 Total Score 13      GAD 7 : Generalized Anxiety Score 01/26/2021  Nervous, Anxious, on Edge 1  Control/stop worrying 0  Worry too much - different things 0  Trouble relaxing 0  Restless 0  Easily annoyed or irritable 1  Afraid - awful might happen 0  Total GAD 7 Score 2  Anxiety Difficulty Somewhat difficult      Relevant past medical, surgical, family and social history reviewed and updated as indicated. Interim medical history since our last visit reviewed. Allergies and medications reviewed and updated.  Review of Systems  Eyes:  Negative for visual disturbance.  Respiratory:  Negative for cough,  chest tightness and shortness of breath.   Cardiovascular:  Negative for chest pain, palpitations and leg swelling.  Neurological:  Negative for dizziness and headaches.  Psychiatric/Behavioral:  Positive for dysphoric mood. Negative for suicidal ideas. The patient is not nervous/anxious.    Per HPI unless specifically indicated above     Objective:    BP 125/75   Pulse 80   Temp 97.6 F (36.4 C) (Oral)   Ht 5' 2.5" (1.588 m)   Wt 150 lb (68 kg)   SpO2 98%   BMI 27.00 kg/m   Wt Readings from Last 3 Encounters:  01/26/21 150 lb (68 kg)  10/25/20 149 lb 8 oz (67.8 kg)  09/13/20 148 lb (67.1 kg)    Physical Exam Vitals and nursing note reviewed.  Constitutional:      General: She is not in acute distress.    Appearance: Normal appearance. She is normal weight. She is not ill-appearing, toxic-appearing or diaphoretic.  HENT:     Head: Normocephalic.     Right Ear: External ear normal.     Left Ear: External ear normal.     Nose: Nose normal.     Mouth/Throat:     Mouth: Mucous membranes are moist.     Pharynx: Oropharynx is clear.  Eyes:     General:        Right eye: No discharge.          Left eye: No discharge.     Extraocular Movements: Extraocular movements intact.     Conjunctiva/sclera: Conjunctivae normal.     Pupils: Pupils are equal, round, and reactive to light.  Cardiovascular:     Rate and Rhythm: Normal rate and regular rhythm.     Heart sounds: No murmur heard. Pulmonary:     Effort: Pulmonary effort is normal. No respiratory distress.     Breath sounds: Normal breath sounds. No wheezing or rales.  Musculoskeletal:     Cervical back: Normal range of motion and neck supple.  Skin:    General: Skin is warm and dry.     Capillary Refill: Capillary refill takes less than 2 seconds.  Neurological:     General: No focal deficit present.     Mental Status: She is alert and oriented to person, place, and time. Mental status is at baseline.  Psychiatric:         Mood and Affect: Mood normal.        Behavior: Behavior normal.        Thought Content: Thought content normal.        Judgment: Judgment normal.    Results for orders placed or performed during the hospital encounter of 01/17/21  SARS CORONAVIRUS 2 (TAT 6-24 HRS) Nasopharyngeal Nasopharyngeal Swab   Specimen: Nasopharyngeal Swab  Result Value Ref Range   SARS Coronavirus 2 NEGATIVE NEGATIVE      Assessment & Plan:   Problem List Items Addressed This Visit       Cardiovascular and Mediastinum   Hypertension - Primary    Chronic.  Controlled.  Continue with current medication regimen on Amlodipine 42m.  Labs ordered today.  Return to clinic in 6 months for reevaluation.  Call sooner if concerns arise.        Relevant Orders   Comp Met (CMET)   Lipid Profile     Other   Depression, recurrent (HFirth    Chronic.  Controlled.  Continue with current medication regimen on Zoloft 1021mdaily and Wellbutrin 30076maily.  Labs ordered today.  Return to clinic in 6 months for reevaluation.  Call sooner if concerns arise.        Hyperlipidemia    Chronic.  Controlled.  Continue with current medication regimen.  Labs ordered today.  Return to clinic in 6 months for reevaluation.  Call sooner if concerns arise.        Relevant Orders   Comp Met (CMET)   Lipid Profile     Follow up plan: Return in about 6 months (around 07/26/2021) for Physical and Fasting labs.

## 2021-01-26 ENCOUNTER — Ambulatory Visit (INDEPENDENT_AMBULATORY_CARE_PROVIDER_SITE_OTHER): Payer: Commercial Managed Care - PPO | Admitting: Nurse Practitioner

## 2021-01-26 ENCOUNTER — Encounter: Payer: Self-pay | Admitting: Nurse Practitioner

## 2021-01-26 ENCOUNTER — Other Ambulatory Visit: Payer: Self-pay

## 2021-01-26 VITALS — BP 125/75 | HR 80 | Temp 97.6°F | Ht 62.5 in | Wt 150.0 lb

## 2021-01-26 DIAGNOSIS — F339 Major depressive disorder, recurrent, unspecified: Secondary | ICD-10-CM

## 2021-01-26 DIAGNOSIS — E782 Mixed hyperlipidemia: Secondary | ICD-10-CM | POA: Diagnosis not present

## 2021-01-26 DIAGNOSIS — I10 Essential (primary) hypertension: Secondary | ICD-10-CM

## 2021-01-26 NOTE — Assessment & Plan Note (Signed)
Chronic.  Controlled.  Continue with current medication regimen on Amlodipine 5mg .  Labs ordered today.  Return to clinic in 6 months for reevaluation.  Call sooner if concerns arise.

## 2021-01-26 NOTE — Assessment & Plan Note (Signed)
Chronic.  Controlled.  Continue with current medication regimen on Zoloft 100mg  daily and Wellbutrin 300mg  daily.  Labs ordered today.  Return to clinic in 6 months for reevaluation.  Call sooner if concerns arise.

## 2021-01-26 NOTE — Assessment & Plan Note (Signed)
Chronic.  Controlled.  Continue with current medication regimen.  Labs ordered today.  Return to clinic in 6 months for reevaluation.  Call sooner if concerns arise.  ? ?

## 2021-01-27 LAB — LIPID PANEL
Chol/HDL Ratio: 3.5 ratio (ref 0.0–4.4)
Cholesterol, Total: 309 mg/dL — ABNORMAL HIGH (ref 100–199)
HDL: 89 mg/dL (ref >39)
LDL Chol Calc (NIH): 191 mg/dL — ABNORMAL HIGH (ref 0–99)
Triglycerides: 160 mg/dL — ABNORMAL HIGH (ref 0–149)
VLDL Cholesterol Cal: 29 mg/dL (ref 5–40)

## 2021-01-27 LAB — COMPREHENSIVE METABOLIC PANEL
ALT: 13 IU/L (ref 0–32)
AST: 17 IU/L (ref 0–40)
Albumin/Globulin Ratio: 3.3 — ABNORMAL HIGH (ref 1.2–2.2)
Albumin: 4.9 g/dL — ABNORMAL HIGH (ref 3.8–4.8)
Alkaline Phosphatase: 67 IU/L (ref 44–121)
BUN/Creatinine Ratio: 21 (ref 12–28)
BUN: 19 mg/dL (ref 8–27)
Bilirubin Total: 0.3 mg/dL (ref 0.0–1.2)
CO2: 24 mmol/L (ref 20–29)
Calcium: 9.9 mg/dL (ref 8.7–10.3)
Chloride: 96 mmol/L (ref 96–106)
Creatinine, Ser: 0.91 mg/dL (ref 0.57–1.00)
Globulin, Total: 1.5 g/dL (ref 1.5–4.5)
Glucose: 81 mg/dL (ref 70–99)
Potassium: 4.5 mmol/L (ref 3.5–5.2)
Sodium: 135 mmol/L (ref 134–144)
Total Protein: 6.4 g/dL (ref 6.0–8.5)
eGFR: 70 mL/min/{1.73_m2} (ref >59)

## 2021-01-27 MED ORDER — ROSUVASTATIN CALCIUM 5 MG PO TABS: 5.0000 mg | ORAL_TABLET | Freq: Every day | ORAL | 1 refills | Status: DC

## 2021-01-27 NOTE — Addendum Note (Signed)
Addended by: Jon Billings on: 01/27/2021 12:10 PM   Modules accepted: Orders

## 2021-01-27 NOTE — Progress Notes (Signed)
Please let patient know that her lab work shows that her cholesterol is elevated from prior.  Her cardiac risk score is elevated in the high risk category.  I recommend she start Crestor 5mg  once daily.  The goal will be to increase the medication to 20mg  daily.  If she agrees, I will send this to the pharmacy for her. Please let me know if she has any questions.   The 10-year ASCVD risk score (Arnett DK, et al., 2019) is: 8.1%   Values used to calculate the score:     Age: 57 years     Sex: Female     Is Non-Hispanic African American: No     Diabetic: No     Tobacco smoker: No     Systolic Blood Pressure: 458 mmHg     Is BP treated: Yes     HDL Cholesterol: 89 mg/dL     Total Cholesterol: 309 mg/dL

## 2021-01-27 NOTE — Progress Notes (Signed)
Medication sent to the pharmacy.

## 2021-02-01 ENCOUNTER — Other Ambulatory Visit: Payer: Self-pay | Admitting: Nurse Practitioner

## 2021-02-01 NOTE — Telephone Encounter (Signed)
Requested Prescriptions  Pending Prescriptions Disp Refills  . pantoprazole (PROTONIX) 40 MG tablet [Pharmacy Med Name: Pantoprazole Sodium 40 MG Oral Tablet Delayed Release] 60 tablet 0    Sig: Take 1 tablet by mouth twice daily as needed     Gastroenterology: Proton Pump Inhibitors Passed - 02/01/2021  2:17 PM      Passed - Valid encounter within last 12 months    Recent Outpatient Visits          6 days ago Primary hypertension   [place], NP   3 months ago Primary hypertension   [facility] Jon Billings, NP   5 months ago Cough   [facility] Jon Billings, NP   6 months ago Depression, recurrent [facility])   [facility] Jon Billings, NP   7 months ago Depression, recurrent [facility])   [facility] Jon Billings, NP      Future Appointments            Tomorrow Flora Lipps, MD [place]   In 5 months Jon Billings, NP [facility]           . sertraline (ZOLOFT) 100 MG tablet [Pharmacy Med Name: Sertraline HCl 100 MG Oral Tablet] 45 tablet 0    Sig: TAKE ONE AND ONE-HALF TABLETS BY MOUTH ONCE DAILY     Psychiatry:  Antidepressants - SSRI Passed - 02/01/2021  2:17 PM      Passed - Completed PHQ-2 or PHQ-9 in the last 360 days      Passed - Valid encounter within last 6 months    Recent Outpatient Visits          6 days ago Primary hypertension   [place], NP   3 months ago Primary hypertension   [facility] Jon Billings, NP   5 months ago Cough   [facility] Jon Billings, NP   6 months ago Depression, recurrent [facility])   [facility] Jon Billings, NP   7 months ago Depression, recurrent [facility])   [facility] Jon Billings, NP      Future Appointments            Tomorrow Flora Lipps, MD [place]   In 5 months  Jon Billings, NP [facility] [place]

## 2021-02-02 ENCOUNTER — Other Ambulatory Visit: Payer: Self-pay

## 2021-02-02 ENCOUNTER — Encounter: Payer: Self-pay | Admitting: Internal Medicine

## 2021-02-02 ENCOUNTER — Ambulatory Visit: Payer: Commercial Managed Care - PPO | Admitting: Internal Medicine

## 2021-02-02 VITALS — BP 130/78 | HR 86 | Temp 98.4°F | Ht 62.5 in | Wt 151.4 lb

## 2021-02-02 DIAGNOSIS — R053 Chronic cough: Secondary | ICD-10-CM | POA: Diagnosis not present

## 2021-02-02 MED ORDER — PANTOPRAZOLE SODIUM 40 MG PO TBEC: DELAYED_RELEASE_TABLET | ORAL | 0 refills | Status: DC

## 2021-02-02 NOTE — Patient Instructions (Signed)
Follow-up with ENT Follow-up with neurology Breztri as needed  Avoid allergens Avoid secondhand smoke

## 2021-02-02 NOTE — Progress Notes (Signed)
[facility] Consultation      Date: 02/02/2021,   MRN# [id number] Krista Phelps 1955-12-12 Code Status:  Code Status History     Date Active Date Inactive Code Status Order ID Comments User Context   04/02/2018 [id number] 04/05/2018 1800 Full Code [id number]  Hessie Knows, MD Inpatient      Questions for Most Recent Historical Code Status (Order [id number])          Admission                  Current  Krista Phelps is a 65 y.o. old female seen in consultation for cough  at the request of Mathis Dad.   Chest x-ray July 2022 No acute process No pneumonia No effusions  Function test 12/2020 Normal spirometry No evidence of obstructive or restrictive lung disease  SYNOPSIS 65 year old pleasant white female seen today for cough which is chronic in nature for many years has progressively worsened over the last 6 months It is described as a chronic productive cough with white phlegm Patient does have reflux and is taking Protonix Patient does have sinus issues and sees an ENT Patient does have occasional rhinorrhea Generic cough syrup helps sometimes Is a former smoker 2 to 3 packs a day for the 10 years but quit 1985 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 She has 11 cats in home   CHIEF COMPLAINT:   Follow-up chronic cough   HISTORY OF PRESENT ILLNESS   No exacerbation at this time No evidence of heart failure at this time No evidence or signs of infection at this time No respiratory distress No fevers, chills, nausea, vomiting, diarrhea No evidence of lower extremity edema No evidence hemoptysis  Patient has stopped using her Breztri inhaler She does not use nasal sprays as prescribed GERD seems to be under control Cough seems to have proved a little   MEDICATIONS    Home Medication:  Current Outpatient Rx   Order #: [id number] Class: Normal   Order #: [id number] Class: Historical Med   Order #: [id number] Class:  Historical Med   Order #: [id number] Class: Sample   Order #: [id number] Class: Normal   Order #: [id number] Class: Historical Med   Order #: [id number] Class: Normal   Order #: [id number] Class: Normal   Order #: [id number] Class: Historical Med   Order #: [id number] Class: Historical Med   Order #: [id number] Class: Historical Med   Order #: [id number] Class: Normal   Order #: [id number] Class: Historical Med   Order #: [id number] Class: Historical Med   Order #: [id number] Class: Normal   Order #: [id number] Class: Normal   Order #: [id number] Class: Normal    Current Medication:  Current Outpatient Medications:    amLODipine (NORVASC) 5 MG tablet, Take 1 tablet (5 mg total) by mouth daily., Disp: 90 tablet, Rfl: 1   Apple Cider Vinegar 500 MG TABS, Take 2,000 mg by mouth daily as needed (for cramps)., Disp: , Rfl:    aspirin 81 MG tablet, Take 81 mg by mouth daily., Disp: , Rfl:    Budeson-Glycopyrrol-Formoterol (BREZTRI AEROSPHERE) 160-9-4.8 MCG/ACT AERO, Inhale 2 puffs into the lungs in the morning and at bedtime., Disp: 5.9 g, Rfl: 0   buPROPion (WELLBUTRIN XL) 300 MG 24 hr tablet, Take 1 tablet (300 mg total) by mouth daily., Disp: 90 tablet, Rfl: 1   calcium carbonate (TUMS - DOSED IN MG ELEMENTAL CALCIUM) 500 MG chewable tablet, Chew 1 tablet by mouth daily as needed for indigestion or heartburn. , Disp: ,  Rfl:    estradiol (ESTRACE VAGINAL) 0.1 MG/GM vaginal cream, Place 1 Applicatorful vaginally at bedtime as needed., Disp: 42.5 g, Rfl: 3   fluticasone (FLONASE) 50 MCG/ACT nasal spray, Place 2 sprays into both nostrils in the morning and at bedtime., Disp: 16 g, Rfl: 0   Homeopathic Products (LEG CRAMPS PO), Take 4 tablets by mouth daily., Disp: , Rfl:    Magnesium 250 MG TABS, Take 1,000 mg by mouth daily as needed (for cramps)., Disp: , Rfl:    Melatonin 10 MG TABS, Take 10 mg by mouth at bedtime. , Disp: , Rfl:    meloxicam (MOBIC) 15 MG tablet, Take 1 tablet (15 mg total) by mouth daily., Disp: 90  tablet, Rfl: 1   Multiple Vitamin (MULTIVITAMIN) tablet, Take 1 tablet by mouth daily., Disp: , Rfl:    Multiple Vitamins-Minerals (PRESERVISION AREDS 2 PO), Take 2 tablets by mouth daily., Disp: , Rfl:    pantoprazole (PROTONIX) 40 MG tablet, Take 1 tablet by mouth twice daily as needed, Disp: 60 tablet, Rfl: 0   rosuvastatin (CRESTOR) 5 MG tablet, Take 1 tablet (5 mg total) by mouth daily., Disp: 90 tablet, Rfl: 1   sertraline (ZOLOFT) 100 MG tablet, TAKE 1 & 1/2 (ONE & ONE-HALF) TABLETS BY MOUTH ONCE DAILY, Disp: 45 tablet, Rfl: 0    ALLERGIES   Atropine     REVIEW OF SYSTEMS     Review of Systems:  Gen:  Denies  fever, sweats, chills weight loss  HEENT: Denies blurred vision, double vision, ear pain, eye pain, hearing loss, nose bleeds, sore throat Cardiac:  No dizziness, chest pain or heaviness, chest tightness,edema, No JVD Resp+cough, +sputum production, -shortness of breath,-wheezing, -hemoptysis,  Other:  All other systems negative   Other:  All other systems negative BP 130/78 (BP Location: Left Arm, Cuff Size: Normal)   Pulse 86   SpO2 98%    Physical Examination:   General Appearance: No distress  EYES PERRLA, EOM intact.   NECK Supple, No JVD Pulmonary: normal breath sounds, No wheezing.  CardiovascularNormal S1,S2.  No m/r/g.   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 All questions answered  Follow up  follow-up 1 year  Total time spent 22 minutes     Maretta Bees Patricia Pesa, M.D.  [facility]  Medical Director [place] Director [facility] Cardio-Pulmonary Department

## 2021-02-02 NOTE — Telephone Encounter (Signed)
Too soon . Last signed 01/23/21. Requested Prescriptions  Signed Prescriptions Disp Refills   pantoprazole (PROTONIX) 40 MG tablet 60 tablet 0    Sig: Take 1 tablet by mouth twice daily as needed     Gastroenterology: Proton Pump Inhibitors Passed - 02/01/2021  2:17 PM      Passed - Valid encounter within last 12 months    Recent Outpatient Visits          1 week ago Primary hypertension   [facility] Jon Billings, NP   3 months ago Primary hypertension   [facility] Jon Billings, NP   5 months ago Cough   [facility] Jon Billings, NP   6 months ago Depression, recurrent [facility])   [facility] Jon Billings, NP   7 months ago Depression, recurrent [facility])   [place], Karen, NP      Future Appointments            In 5 months Jon Billings, NP [facility]           Refused Prescriptions Disp Refills  . sertraline (ZOLOFT) 100 MG tablet [Pharmacy Med Name: Sertraline HCl 100 MG Oral Tablet] 45 tablet 0    Sig: TAKE ONE AND ONE-HALF TABLETS BY MOUTH ONCE DAILY     Psychiatry:  Antidepressants - SSRI Passed - 02/01/2021  2:17 PM      Passed - Completed PHQ-2 or PHQ-9 in the last 360 days      Passed - Valid encounter within last 6 months    Recent Outpatient Visits          1 week ago Primary hypertension   [facility] Jon Billings, NP   3 months ago Primary hypertension   [facility] Jon Billings, NP   5 months ago Cough   [facility] Jon Billings, NP   6 months ago Depression, recurrent [facility])   [facility] Jon Billings, NP   7 months ago Depression, recurrent [facility])   [place], Karen, NP      Future Appointments            In 5 months Jon Billings, NP [facility], [place]

## 2021-03-02 ENCOUNTER — Other Ambulatory Visit: Payer: Self-pay | Admitting: Nurse Practitioner

## 2021-03-03 NOTE — Telephone Encounter (Signed)
Requested Prescriptions  Pending Prescriptions Disp Refills   meloxicam (MOBIC) 15 MG tablet [Pharmacy Med Name: Meloxicam 15 MG Oral Tablet] 90 tablet 1    Sig: Take 1 tablet by mouth once daily     Analgesics:  COX2 Inhibitors Failed - 03/02/2021 10:17 AM      Failed - HGB in normal range and within 360 days    Hemoglobin  Date Value Ref Range Status  04/04/2018 10.8 (L) 12.0 - 15.0 g/dL Final  01/16/2018 14.3 11.1 - 15.9 g/dL Final         Passed - Cr in normal range and within 360 days    Creatinine, Ser  Date Value Ref Range Status  01/26/2021 0.91 0.57 - 1.00 mg/dL Final         Passed - Patient is not pregnant      Passed - Valid encounter within last 12 months    Recent Outpatient Visits          1 month ago Primary hypertension   [place], Karen, NP   4 months ago Primary hypertension   [facility] Jon Billings, NP   6 months ago Cough   [facility] Jon Billings, NP   7 months ago Depression, recurrent ([place])   [facility] Jon Billings, NP   8 months ago Depression, recurrent ([place])   [place], Karen, NP      Future Appointments            In 4 months Jon Billings, NP [place], [facility]            sertraline (ZOLOFT) 100 MG tablet [Pharmacy Med Name: Sertraline HCl 100 MG Oral Tablet] 135 tablet 1    Sig: TAKE ONE AND ONE-HALF TABLETS BY MOUTH ONCE DAILY     Psychiatry:  Antidepressants - SSRI Passed - 03/02/2021 10:17 AM      Passed - Completed PHQ-2 or PHQ-9 in the last 360 days      Passed - Valid encounter within last 6 months    Recent Outpatient Visits          1 month ago Primary hypertension   [facility] Jon Billings, NP   4 months ago Primary hypertension   [place], [place], NP   6 months ago Cough   [facility] Jon Billings, NP   7 months ago Depression,  recurrent ([place])   [facility] Jon Billings, NP   8 months ago Depression, recurrent ([place])   [facility] Jon Billings, NP      Future Appointments            In 4 months Jon Billings, NP [place], [facility]            buPROPion (WELLBUTRIN XL) 300 MG 24 hr tablet [Pharmacy Med Name: buPROPion HCl ER (XL) 300 MG Oral Tablet Extended Release 24 Hour] 90 tablet 1    Sig: Take 1 tablet by mouth once daily     Psychiatry: Antidepressants - bupropion Passed - 03/02/2021 10:17 AM      Passed - Completed PHQ-2 or PHQ-9 in the last 360 days      Passed - Last BP in normal range    BP Readings from Last 1 Encounters:  02/02/21 130/78         Passed - Valid encounter within last 6 months    Recent Outpatient Visits  1 month ago Primary hypertension   [facility] Jon Billings, NP   4 months ago Primary hypertension   [facility] Jon Billings, NP   6 months ago Cough   [place], NP   7 months ago Depression, recurrent [facility])   [facility] Jon Billings, NP   8 months ago Depression, recurrent [facility])   [facility] Jon Billings, NP      Future Appointments            In 4 months Jon Billings, NP [facility], [place]

## 2021-03-10 ENCOUNTER — Other Ambulatory Visit: Payer: Self-pay | Admitting: Neurology

## 2021-03-10 DIAGNOSIS — H93A1 Pulsatile tinnitus, right ear: Secondary | ICD-10-CM

## 2021-03-10 DIAGNOSIS — D329 Benign neoplasm of meninges, unspecified: Secondary | ICD-10-CM

## 2021-03-22 ENCOUNTER — Ambulatory Visit
Admission: RE | Admit: 2021-03-22 | Discharge: 2021-03-22 | Disposition: A | Discharge status: Home / Self Care | Payer: Commercial Managed Care - PPO | Source: Ambulatory Visit | Attending: Neurology | Admitting: Neurology

## 2021-03-22 ENCOUNTER — Other Ambulatory Visit: Payer: Self-pay

## 2021-03-22 DIAGNOSIS — D329 Benign neoplasm of meninges, unspecified: Secondary | ICD-10-CM | POA: Insufficient documentation

## 2021-03-22 DIAGNOSIS — H93A1 Pulsatile tinnitus, right ear: Secondary | ICD-10-CM | POA: Diagnosis not present

## 2021-03-22 IMAGING — MR MR MRV HEAD WO/W CM
4 of 6 series · 35 of 48 positions shown · IV contrast (gadavist)
Comparison: Brain MRI and intracranial MRA [DATE].

CLINICAL DATA: 65-year-old female with history of planum
sphenoidale meningioma. No surgery. Dizziness and "noise" in the
right ear.

EXAM:
MRI HEAD WITHOUT AND WITH CONTRAST
MR VENOGRAM HEAD WITHOUT AND WITH CONTRAST
TECHNIQUE: Multiplanar, multi-echo pulse sequences of the brain and surrounding
structures were acquired without and with intravenous contrast.
Angiographic images of the intracranial venous structures were
acquired using MRV technique without and with intravenous contrast.
CONTRAST:  7mL GADAVIST GADOBUTROL 1 MMOL/ML IV SOLN

[Series 5: TOF · coronal · 2.5mm · 0.98mm/px · 9 of 125 slices shown]
[im 1/125]
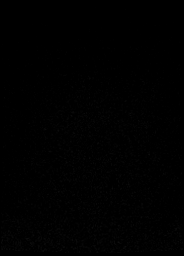
[im 16/125]
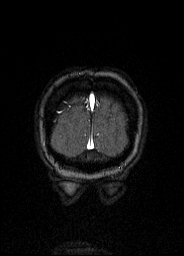
[im 32/125]
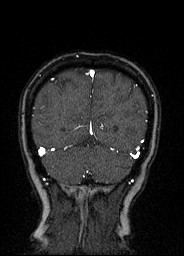
[im 47/125]
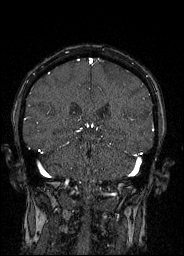
[im 63/125]
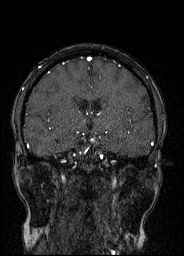
[im 78/125]
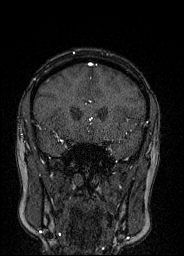
[im 94/125]
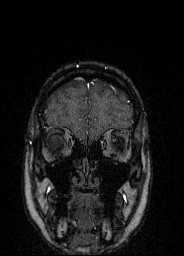
[im 109/125]
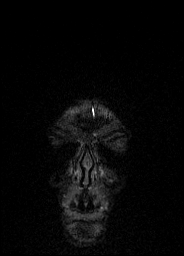
[im 125/125]
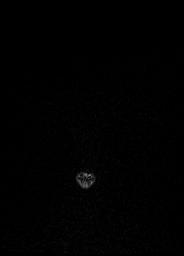

[Series 12: T1 post-contrast · sagittal · 1.0mm · 0.98mm/px · 10 of 191 slices shown]
[im 1/191]
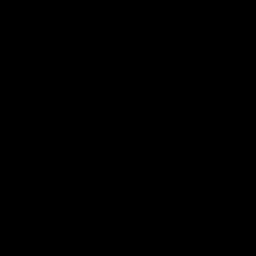
[im 16/191]
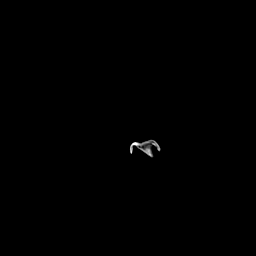
[im 32/191]
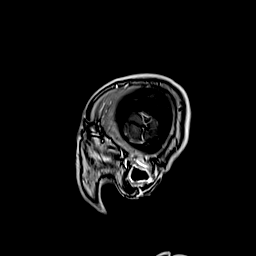
[im 64/191]
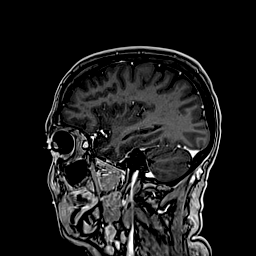
[im 80/191]
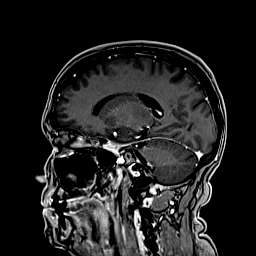
[im 96/191]
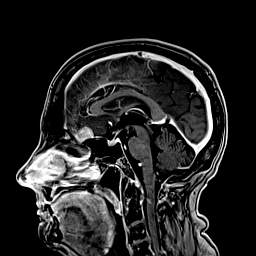
[im 111/191]
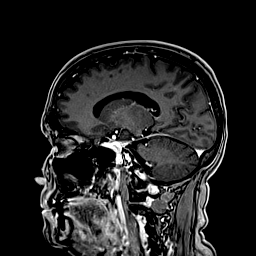
[im 127/191]
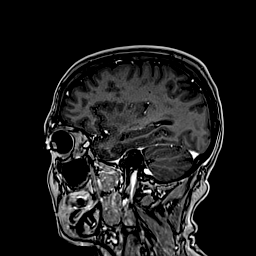
[im 159/191]
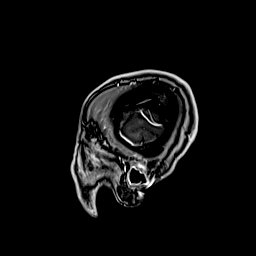
[im 191/191]
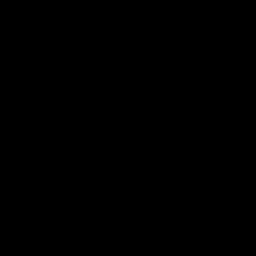

[Series 1035: cor thins rl · coronal · 3.0mm · 0.49mm/px · 7 of 107 slices shown]
[im 1/107]
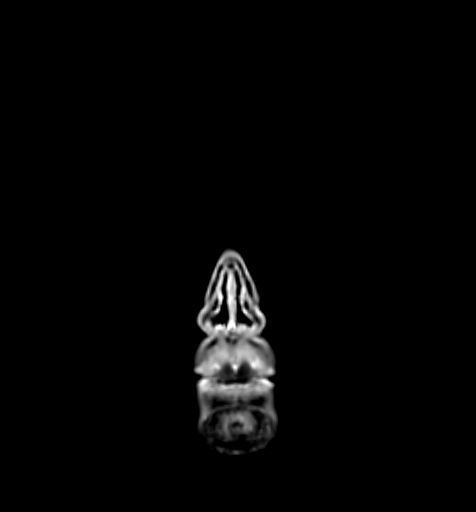
[im 18/107]
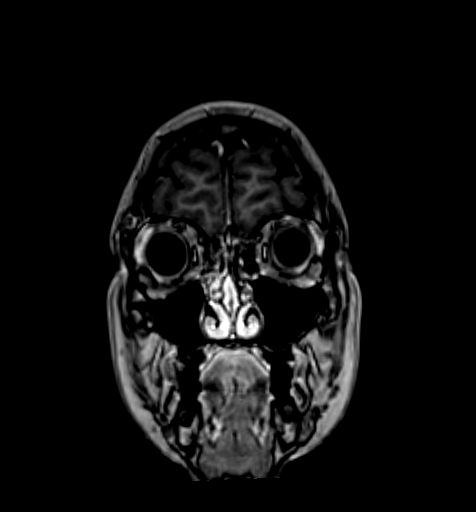
[im 36/107]
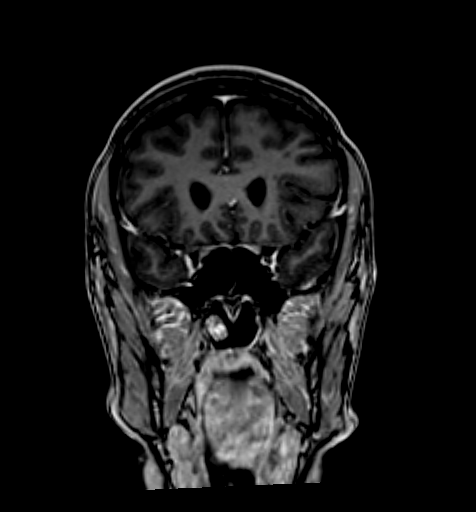
[im 54/107]
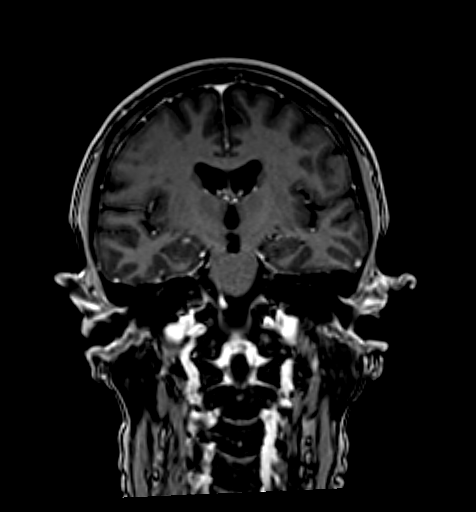
[im 71/107]
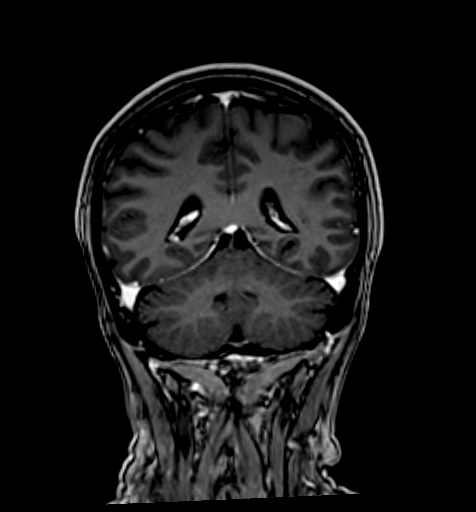
[im 89/107]
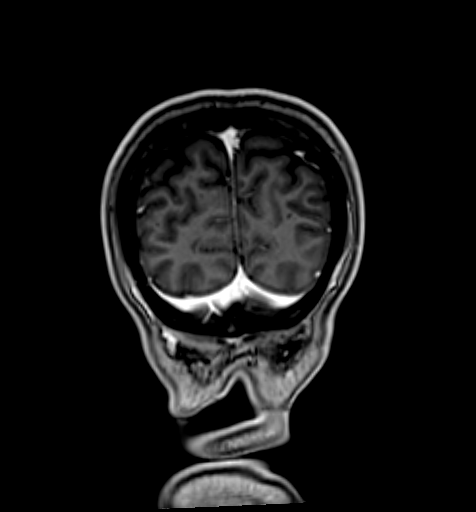
[im 107/107]
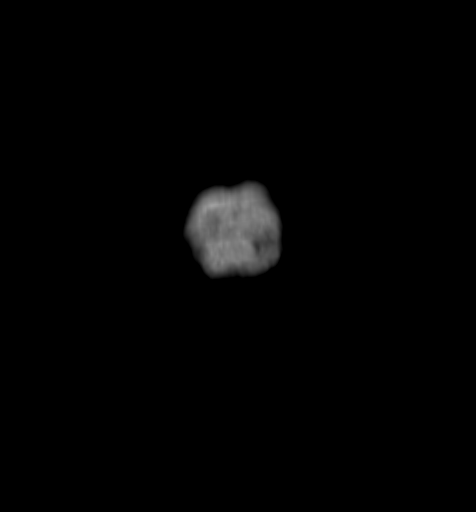

[Series 1041: T2 · axial · 1.0mm · 0.49mm/px · z∈[-74,+101]mm · 9 of 181 slices shown]
[im 1/181]
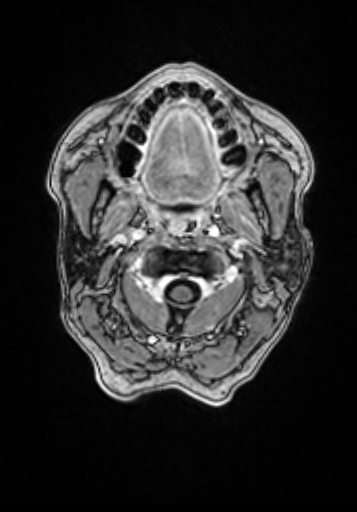
[im 31/181]
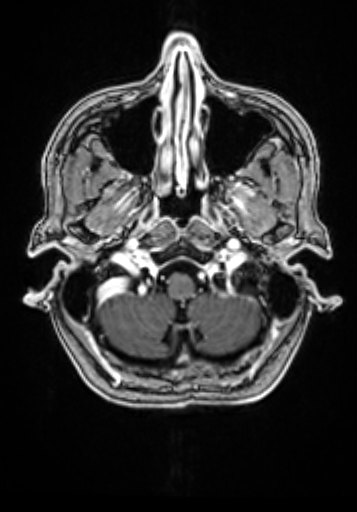
[im 61/181]
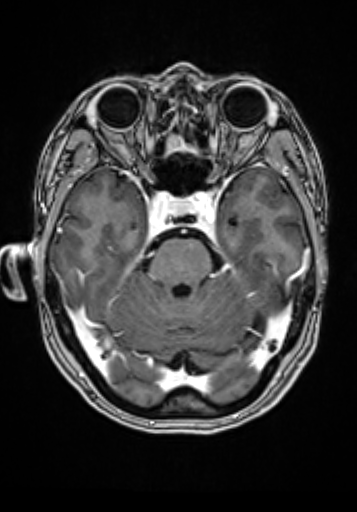
[im 76/181]
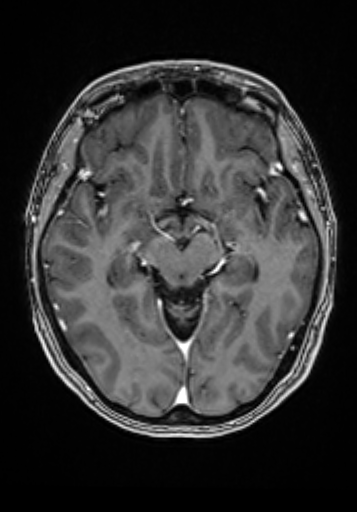
[im 91/181]
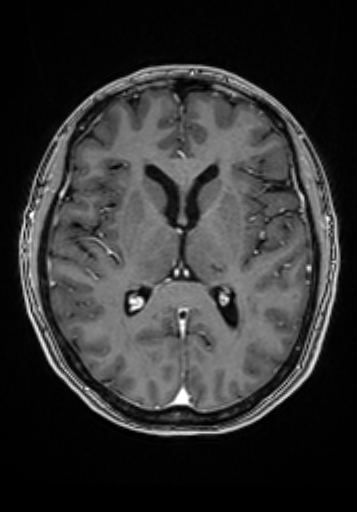
[im 106/181]
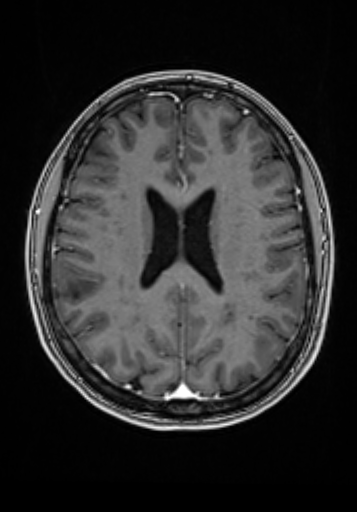
[im 121/181]
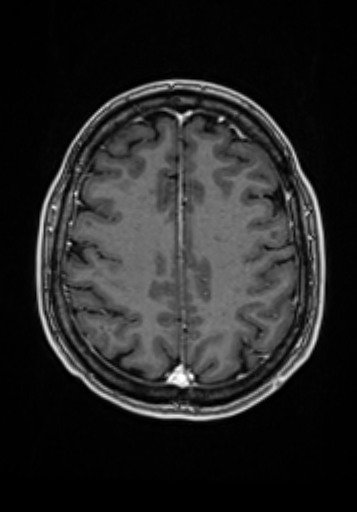
[im 151/181]
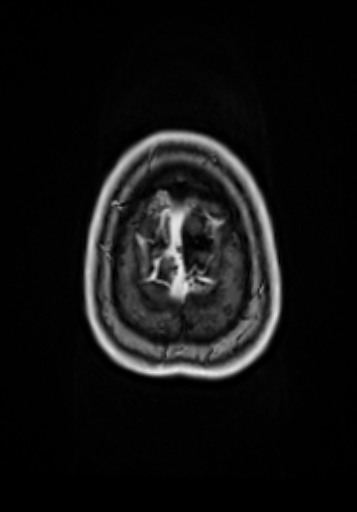
[im 181/181]
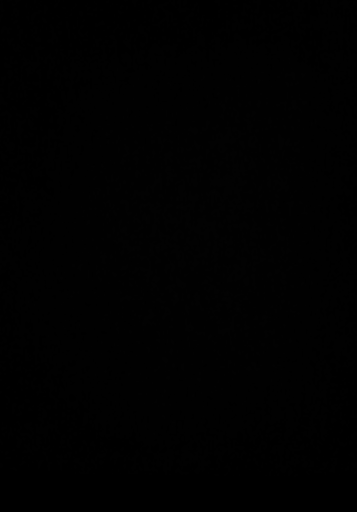

[35 of 48 positions shown; findings below may reference images not displayed]

FINDINGS: MRI HEAD WITHOUT AND WITH CONTRAST

Brain: Rounded planum sphenoidal homogeneously enhancing meningioma
encompasses 20 x 19 by 12 mm (AP by transverse by CC), stable and
size and configuration since last year. Mild mass effect on the
adjacent inferior frontal gyri (series 17, image 23) with no frontal
lobe edema.

No other abnormal intracranial enhancement or dural thickening
identified.

No restricted diffusion to suggest acute infarction. No midline
shift, no other intracranial mass effect or mass lesion.
Ventriculomegaly, extra-axial fluid collection or acute intracranial
hemorrhage. Cervicomedullary junction and pituitary are within
normal limits.

Moderately scattered, occasionally Patchy and confluent bilateral
cerebral white matter T2 and FLAIR hyperintensity is stable and in a
nonspecific configuration. No cortical encephalomalacia or chronic
cerebral blood products.

Vascular: Major intracranial vascular flow voids are stable.
Dominant left vertebral artery.

Skull and upper cervical spine: Negative for age visible cervical
spine. Visualized bone marrow signal is within normal limits.

Sinuses/Orbits: Negative orbits. Paranasal sinuses remain clear.

Other: Mastoid air cells remain clear. Visible internal auditory
structures appear normal. Normal stylomastoid foramina.

There is a subtle 6 mm skin lesion posterior to the right ear (right
suboccipital series 16 image 15 and series 17, image 9) which
appears to be enhancing. This is nonspecific. Elsewhere negative
visible scalp and face.

MR VENOGRAM HEAD WITHOUT AND WITH CONTRAST

2D, 3D time-of-flight and postcontrast intracranial MRV. Preserved
flow signal enhancement in the superior sagittal sinus, torcula,
straight sinus, internal cerebral veins, basal veins of JAYLON,
transverse sinuses, sigmoid sinuses and IJ bulbs. Normal left
transverse/sigmoid junction arachnoid granulation. Cavernous sinuses
appears symmetric and normal. Bilateral IJ bulbs and visible
internal jugular veins appear patent and unremarkable.
IMPRESSION: 1.  No acute intracranial abnormality.
Stable solitary planum sphenoidal meningioma since last year, 20 mm
x 19 x 12 mm. Unchanged mild mass effect on the adjacent inferior
frontal gyri with no frontal lobe edema.
Stable moderate for age cerebral white nonspecific cerebral white
matter signal changes.

2. Normal intracranial MRV.

3. Small nonspecific 6 mm right suboccipital skin lesion (series 18,
image 15), recommend correlation with physical exam.

## 2021-03-22 IMAGING — MR MR HEAD WO/W CM
16 series · 47 of 48 positions shown · IV contrast (7ml Gadavist)
Comparison: Brain MRI and intracranial MRA [DATE].

CLINICAL DATA: 65-year-old female with history of planum
sphenoidale meningioma. No surgery. Dizziness and "noise" in the
right ear.

EXAM:
MRI HEAD WITHOUT AND WITH CONTRAST
MR VENOGRAM HEAD WITHOUT AND WITH CONTRAST
TECHNIQUE: Multiplanar, multi-echo pulse sequences of the brain and surrounding
structures were acquired without and with intravenous contrast.
Angiographic images of the intracranial venous structures were
acquired using MRV technique without and with intravenous contrast.
CONTRAST:  7mL GADAVIST GADOBUTROL 1 MMOL/ML IV SOLN

[Series 5: ax dwi_tracew · axial · 3.0mm · 0.65mm/px · z∈[-60,+94]mm · 3 of 48 slices shown]
[im 1/48]
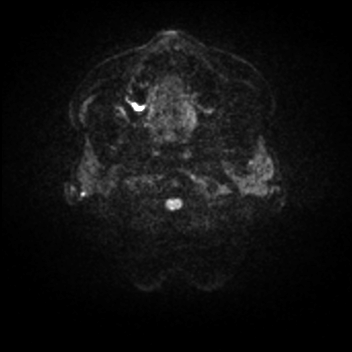
[im 24/48]
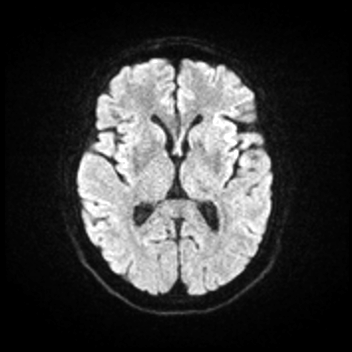
[im 48/48]
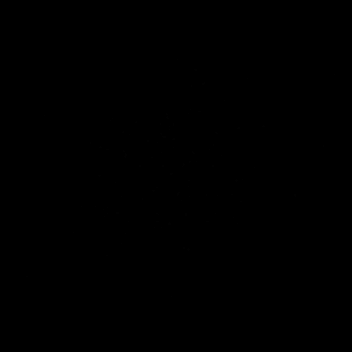

[Series 6: ax dwi_adc · axial · 3.0mm · 0.65mm/px · z∈[-60,+88]mm · 3 of 46 slices shown]
[im 1/46]
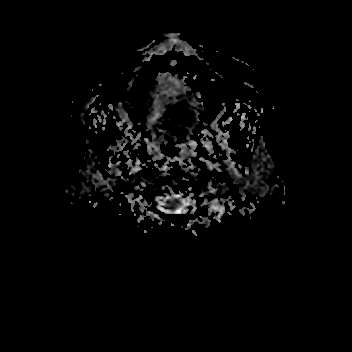
[im 23/46]
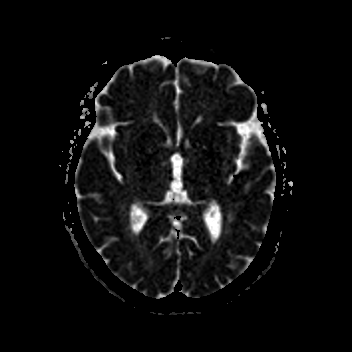
[im 46/46]
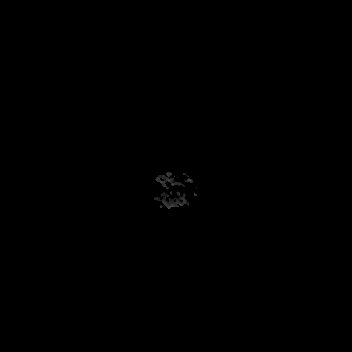

[Series 7: cor dwi_tracew · coronal · 5.0mm · 0.65mm/px · 2 of 40 slices shown]
[im 1/40]
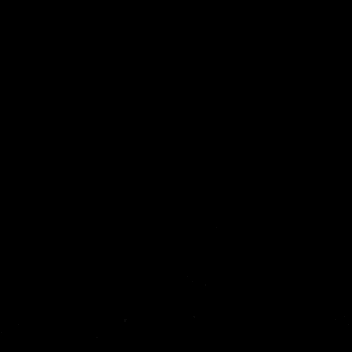
[im 40/40]
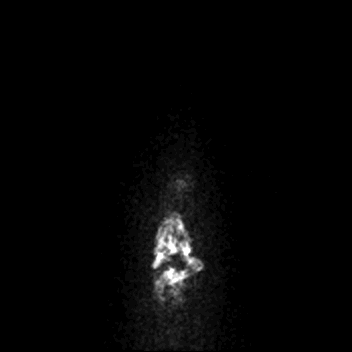

[Series 8: cor dwi_adc · coronal · 5.0mm · 0.65mm/px · 2 of 37 slices shown]
[im 1/37]
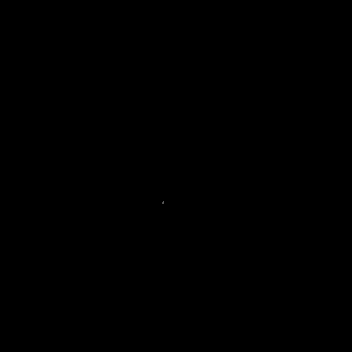
[im 37/37]
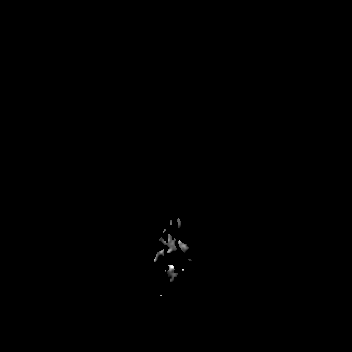

[Series 9: T1 · sagittal · 5.0mm · 0.62mm/px · 1 of 25 slices shown (1 of 2)]
[im 1/25]
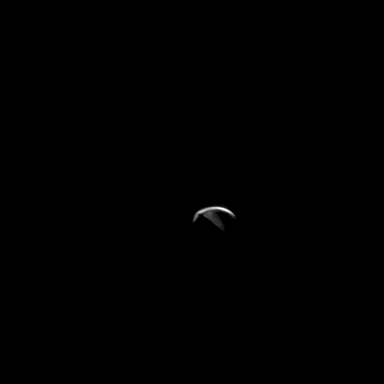

[Series 10: T2 · axial · 5.0mm · 0.53mm/px · 1 of 25 slices shown]
[im 1/25]
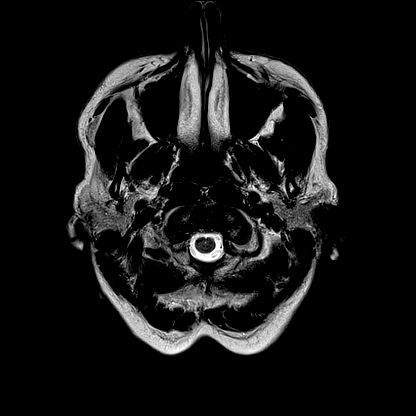

[Series 11: mag_images · axial · 3.0mm · 0.90mm/px · z∈[-71,+105]mm · 3 of 60 slices shown]
[im 1/60]
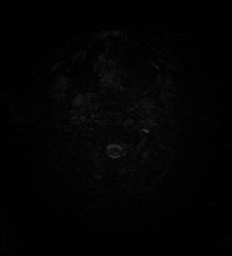
[im 30/60]
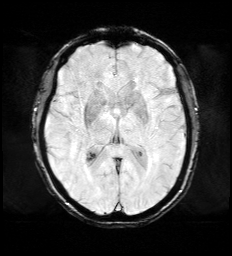
[im 60/60]
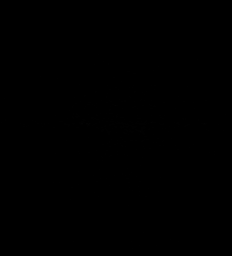

[Series 12: pha_images · axial · 3.0mm · 0.90mm/px · z∈[-71,+102]mm · 3 of 58 slices shown]
[im 1/58]
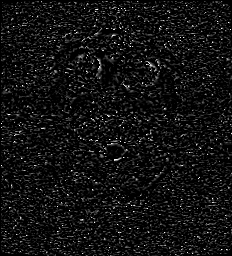
[im 29/58]
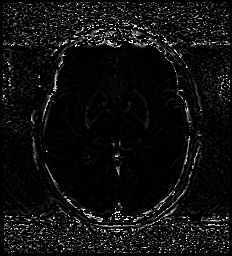
[im 58/58]
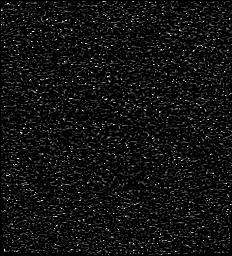

[Series 13: swi_images · axial · 3.0mm · 0.90mm/px · z∈[-71,+105]mm · 3 of 59 slices shown]
[im 1/59]
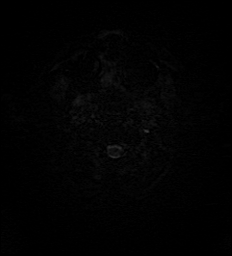
[im 30/59]
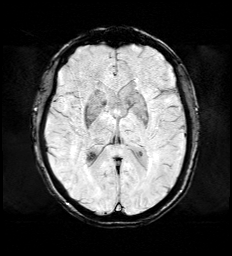
[im 59/59]
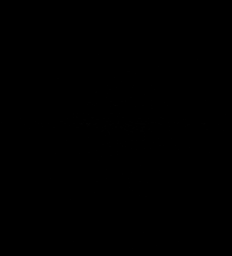

[Series 14: mip_images(sw) · axial · 24.0mm · 0.90mm/px · z∈[-60,+95]mm · 3 of 53 slices shown]
[im 1/53]
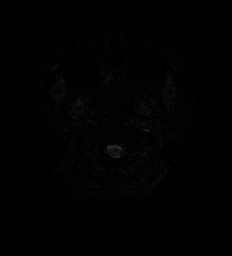
[im 27/53]
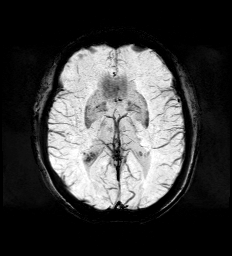
[im 53/53]
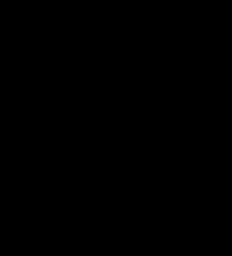

[Series 15: FLAIR · axial · 3.0mm · 0.53mm/px · z∈[-64,+97]mm · 3 of 55 slices shown]
[im 1/55]
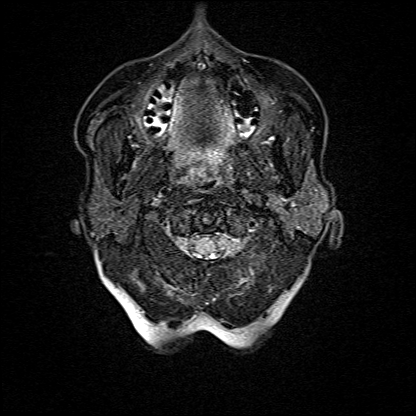
[im 28/55]
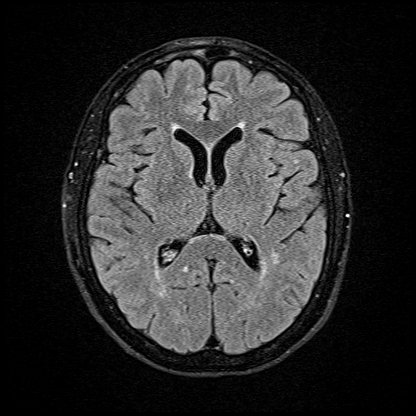
[im 55/55]
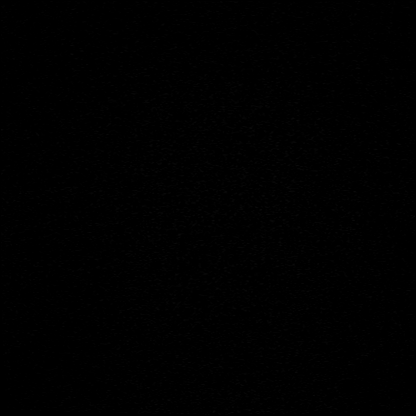

[Series 16: T1 · axial · 1.0mm · 0.98mm/px · z∈[-69,+105]mm · 8 of 175 slices shown (2 of 2)]
[im 1/175]
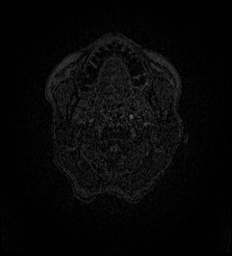
[im 22/175]
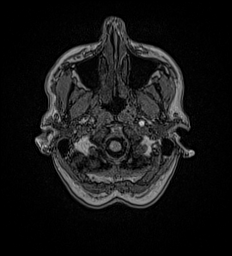
[im 44/175]
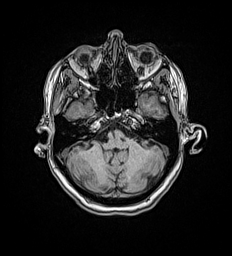
[im 66/175]
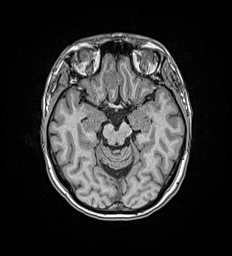
[im 109/175]
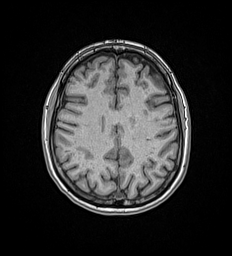
[im 131/175]
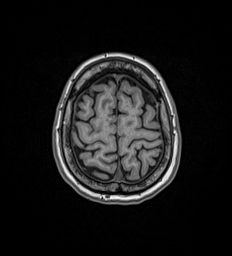
[im 153/175]
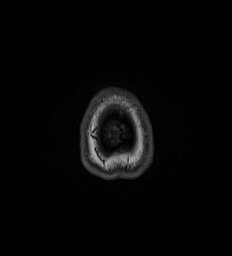
[im 175/175]
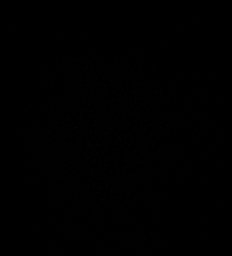

[Series 17: T2 post-contrast · coronal · 5.0mm · 0.57mm/px · 1 of 29 slices shown]
[im 1/29]
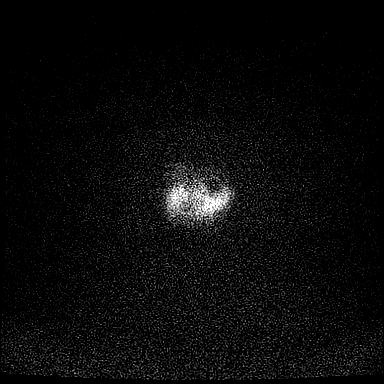

[Series 18: T1 post-contrast · axial · 1.0mm · 0.98mm/px · z∈[-69,+105]mm · 9 of 175 slices shown (1 of 3)]
[im 1/175]
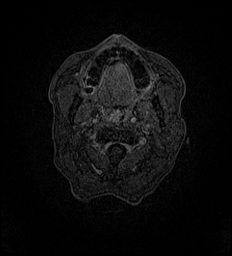
[im 22/175]
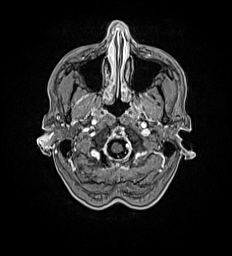
[im 44/175]
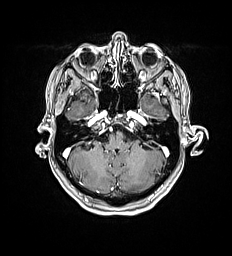
[im 66/175]
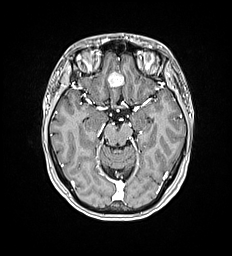
[im 88/175]
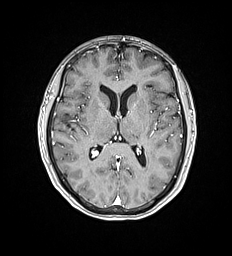
[im 109/175]
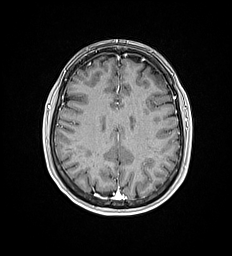
[im 131/175]
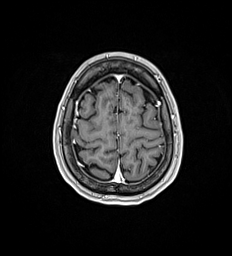
[im 153/175]
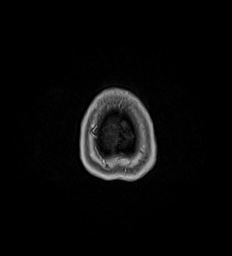
[im 175/175]
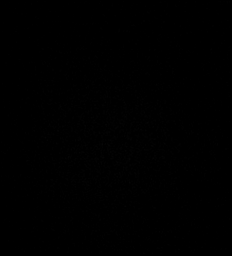

[Series 19: T1 post-contrast · coronal · 5.0mm · 0.57mm/px · 1 of 29 slices shown (2 of 3)]
[im 1/29]
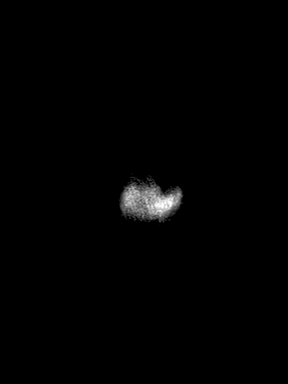

[Series 20: T1 post-contrast · sagittal · 5.0mm · 0.62mm/px · 1 of 25 slices shown (3 of 3)]
[im 1/25]
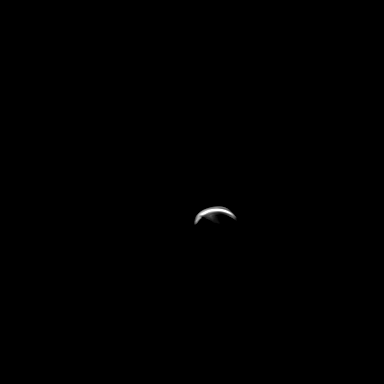

[47 of 48 positions shown; findings below may reference images not displayed]

FINDINGS: MRI HEAD WITHOUT AND WITH CONTRAST

Brain: Rounded planum sphenoidal homogeneously enhancing meningioma
encompasses 20 x 19 by 12 mm (AP by transverse by CC), stable and
size and configuration since last year. Mild mass effect on the
adjacent inferior frontal gyri (series 17, image 23) with no frontal
lobe edema.

No other abnormal intracranial enhancement or dural thickening
identified.

No restricted diffusion to suggest acute infarction. No midline
shift, no other intracranial mass effect or mass lesion.
Ventriculomegaly, extra-axial fluid collection or acute intracranial
hemorrhage. Cervicomedullary junction and pituitary are within
normal limits.

Moderately scattered, occasionally Patchy and confluent bilateral
cerebral white matter T2 and FLAIR hyperintensity is stable and in a
nonspecific configuration. No cortical encephalomalacia or chronic
cerebral blood products.

Vascular: Major intracranial vascular flow voids are stable.
Dominant left vertebral artery.

Skull and upper cervical spine: Negative for age visible cervical
spine. Visualized bone marrow signal is within normal limits.

Sinuses/Orbits: Negative orbits. Paranasal sinuses remain clear.

Other: Mastoid air cells remain clear. Visible internal auditory
structures appear normal. Normal stylomastoid foramina.

There is a subtle 6 mm skin lesion posterior to the right ear (right
suboccipital series 16 image 15 and series 17, image 9) which
appears to be enhancing. This is nonspecific. Elsewhere negative
visible scalp and face.

MR VENOGRAM HEAD WITHOUT AND WITH CONTRAST

2D, 3D time-of-flight and postcontrast intracranial MRV. Preserved
flow signal enhancement in the superior sagittal sinus, torcula,
straight sinus, internal cerebral veins, basal veins of JAYLON,
transverse sinuses, sigmoid sinuses and IJ bulbs. Normal left
transverse/sigmoid junction arachnoid granulation. Cavernous sinuses
appears symmetric and normal. Bilateral IJ bulbs and visible
internal jugular veins appear patent and unremarkable.
IMPRESSION: 1.  No acute intracranial abnormality.
Stable solitary planum sphenoidal meningioma since last year, 20 mm
x 19 x 12 mm. Unchanged mild mass effect on the adjacent inferior
frontal gyri with no frontal lobe edema.
Stable moderate for age cerebral white nonspecific cerebral white
matter signal changes.

2. Normal intracranial MRV.

3. Small nonspecific 6 mm right suboccipital skin lesion (series 18,
image 15), recommend correlation with physical exam.

## 2021-03-22 MED ORDER — GADOBUTROL 1 MMOL/ML IV SOLN
7.0000 mL | Freq: Once | INTRAVENOUS | Status: AC | PRN
  Administered 2021-03-22: 11:00:00 7 mL via INTRAVENOUS

## 2021-05-03 ENCOUNTER — Other Ambulatory Visit: Payer: Self-pay | Admitting: Nurse Practitioner

## 2021-05-04 NOTE — Telephone Encounter (Signed)
Requested Prescriptions  ?Pending Prescriptions Disp Refills  ?? amLODipine (NORVASC) 5 MG tablet [Pharmacy Med Name: amLODIPine Besylate 5 MG Oral Tablet] 90 tablet 0  ?  Sig: Take 1 tablet by mouth once daily  ?  ? Cardiovascular: Calcium Channel Blockers 2 Passed - 05/03/2021  2:40 PM  ?  ?  Passed - Last BP in normal range  ?  BP Readings from Last 1 Encounters:  ?02/02/21 130/78  ?   ?  ?  Passed - Last Heart Rate in normal range  ?  Pulse Readings from Last 1 Encounters:  ?02/02/21 86  ?   ?  ?  Passed - Valid encounter within last 6 months  ?  Recent Outpatient Visits   ?      ? 3 months ago Primary hypertension  ? [place], NP  ? 6 months ago Primary hypertension  ? [place], NP  ? 8 months ago Cough  ? [place], NP  ? 9 months ago Depression, recurrent ([place])  ? [place], NP  ? 10 months ago Depression, recurrent ([place])  ? [facility] Jon Billings, NP  ?  ?  ?Future Appointments   ?        ? In 2 months Jon Billings, NP [facility]  ?  ? ?  ?  ?  ? ?

## 2021-06-08 ENCOUNTER — Other Ambulatory Visit: Payer: Self-pay | Admitting: Nurse Practitioner

## 2021-06-09 NOTE — Telephone Encounter (Signed)
Requested medication (s) are due for refill today: no ? ?Requested medication (s) are on the active medication list: yes ? ?Last refill:  05/04/21 ? ?Future visit scheduled:yes ? ?Notes to clinic:  Refilled requested too soon. Refilled 05/04/21 for 90 days. ? ? ?  ?Requested Prescriptions  ?Pending Prescriptions Disp Refills  ? amLODipine (NORVASC) 5 MG tablet [Pharmacy Med Name: amLODIPine Besylate 5 MG Oral Tablet] 90 tablet 0  ?  Sig: Take 1 tablet by mouth once daily  ?  ? Cardiovascular: Calcium Channel Blockers 2 Passed - 06/08/2021  6:57 PM  ?  ?  Passed - Last BP in normal range  ?  BP Readings from Last 1 Encounters:  ?02/02/21 130/78  ?  ?  ?  ?  Passed - Last Heart Rate in normal range  ?  Pulse Readings from Last 1 Encounters:  ?02/02/21 86  ?  ?  ?  ?  Passed - Valid encounter within last 6 months  ?  Recent Outpatient Visits   ? ?      ? 4 months ago Primary hypertension  ? [place], NP  ? 7 months ago Primary hypertension  ? [place], NP  ? 9 months ago Cough  ? [place], NP  ? 10 months ago Depression, recurrent ([place])  ? [place], NP  ? 11 months ago Depression, recurrent ([place])  ? [facility] Jon Billings, NP  ? ?  ?  ?Future Appointments   ? ?        ? In 1 month Jon Billings, NP [facility]  ? ?  ? ?  ?  ?  ? ? ?

## 2021-07-01 ENCOUNTER — Telehealth: Payer: Self-pay

## 2021-07-01 NOTE — Telephone Encounter (Signed)
Copied from [place] [phone]. Topic: General - Other ?>> Jul 01, 2021  2:04 PM Tessa Lerner A wrote: ?Reason for CRM: The patient has called to request that a CPAP usage form be signed off on during their physical on 07/27/21 ? ?Please contact further if needed ?>> Jul 01, 2021  2:06 PM Tessa Lerner A wrote: ?The patient has the form, shares that is it minimal and says they will bring it with them to the appointment  ?

## 2021-07-01 NOTE — Telephone Encounter (Signed)
FYI to provider

## 2021-07-26 NOTE — Progress Notes (Deleted)
There were no vitals taken for this visit.   Subjective:    Patient ID: Krista Phelps, female    DOB: 07/17/1955, 66 y.o.   MRN: [id number]  HPI: Krista Phelps is a 66 y.o. female presenting on 07/27/2021 for comprehensive medical examination. Current medical complaints include:{Blank single:19197::"none","***"}  She currently lives with: Menopausal Symptoms: {Blank single:19197::"yes","no"}  HYPERTENSION / [place] Satisfied with current treatment? {Blank single:19197::"yes","no"} Duration of hypertension: {Blank single:19197::"chronic","months","years"} BP monitoring frequency: {Blank single:19197::"not checking","rarely","daily","weekly","monthly","a few times a day","a few times a week","a few times a month"} BP range:  BP medication side effects: {Blank single:19197::"yes","no"} Past BP meds: {Blank XFGHWEXH:37169::"CVEL","FYBOFBPZWC","HENIDPOEUM/PNTIRWERXV","QMGQQPYP","PJKDTOIZTI","WPYKDXIPJA/SNKN","LZJQBHALPF (bystolic)","carvedilol","chlorthalidone","clonidine","diltiazem","exforge HCT","HCTZ","irbesartan (avapro)","labetalol","lisinopril","lisinopril-HCTZ","losartan (cozaar)","methyldopa","nifedipine","olmesartan (benicar)","olmesartan-HCTZ","quinapril","ramipril","spironalactone","tekturna","valsartan","valsartan-HCTZ","verapamil"} Duration of hyperlipidemia: {Blank single:19197::"chronic","months","years"} Cholesterol medication side effects: {Blank single:19197::"yes","no"} Cholesterol supplements: {Blank multiple:19196::"none","fish oil","niacin","red yeast rice"} Past cholesterol medications: {Blank multiple:19196::"none","atorvastain (lipitor)","lovastatin (mevacor)","pravastatin (pravachol)","rosuvastatin (crestor)","simvastatin (zocor)","vytorin","fenofibrate (tricor)","gemfibrozil","ezetimide (zetia)","niaspan","lovaza"} Medication compliance: {Blank single:19197::"excellent compliance","good compliance","fair compliance","poor compliance"} Aspirin: {Blank  single:19197::"yes","no"} Recent stressors: {Blank single:19197::"yes","no"} Recurrent headaches: {Blank single:19197::"yes","no"} Visual changes: {Blank single:19197::"yes","no"} Palpitations: {Blank single:19197::"yes","no"} Dyspnea: {Blank single:19197::"yes","no"} Chest pain: {Blank single:19197::"yes","no"} Lower extremity edema: {Blank single:19197::"yes","no"} Dizzy/lightheaded: {Blank single:19197::"yes","no"}  MOOD  Depression Screen done today and results listed below:     01/26/2021   11:02 AM 10/25/2020   10:46 AM 06/23/2020    9:24 AM 01/14/2020   10:24 AM 04/24/2019    3:52 PM  Depression screen PHQ 2/9  Decreased Interest 3 0 1 3 3   Down, Depressed, Hopeless 1 0 0 2 0  PHQ - 2 Score 4 0 1 5 3   Altered sleeping 3 3 3 3  0  Tired, decreased energy 3 0 3 3 3   Change in appetite 3 0 3 1 3   Feeling bad or failure about yourself  0 0 0 0 1  Trouble concentrating 0 0 0 0 0  Moving slowly or fidgety/restless 0 0 3 0 0  Suicidal thoughts 0 0 0 0 0  PHQ-9 Score 13 3 13 12 10   Difficult doing work/chores Somewhat difficult Not difficult at all Not difficult at all  Not difficult at all    The patient {has/does not have:19849} a history of falls. I {did/did not:19850} complete a risk assessment for falls. A plan of care for falls {was/was not:19852} documented.   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   aspirin 81 MG tablet Take 81 mg by mouth daily.   Budeson-Glycopyrrol-Formoterol (BREZTRI AEROSPHERE) 160-9-4.8 MCG/ACT AERO Inhale 2 puffs into the lungs in the morning and at bedtime.   buPROPion (WELLBUTRIN XL) 300 MG 24 hr tablet Take 1 tablet by mouth once daily   calcium carbonate (TUMS -  DOSED IN MG ELEMENTAL CALCIUM) 500 MG chewable tablet Chew 1 tablet by mouth daily as needed for indigestion or heartburn.    estradiol (ESTRACE VAGINAL) 0.1 MG/GM vaginal cream Place 1 Applicatorful vaginally at bedtime as needed.   fluticasone (FLONASE) 50 MCG/ACT nasal spray Place 2 sprays into both nostrils in the morning and at bedtime.   Homeopathic Products (LEG CRAMPS PO) Take 4 tablets by mouth daily.   Magnesium 250 MG TABS Take 1,000 mg by mouth daily as needed (for cramps).   Melatonin 10 MG TABS Take 10 mg by mouth at bedtime.    meloxicam (MOBIC) 15 MG tablet Take 1 tablet by mouth once daily   Multiple Vitamin (MULTIVITAMIN) tablet Take 1 tablet by mouth daily.   Multiple Vitamins-Minerals (PRESERVISION AREDS 2 PO) Take 2 tablets by mouth daily.   pantoprazole (PROTONIX) 40 MG tablet Take 1 tablet by mouth twice daily as needed   rosuvastatin (CRESTOR) 5 MG tablet Take 1 tablet (5 mg total) by mouth daily.   sertraline (ZOLOFT) 100 MG tablet TAKE ONE AND ONE-HALF TABLETS BY MOUTH ONCE DAILY   No current facility-administered medications on file prior to visit.    Allergies:  Allergies  Allergen Reactions   Atropine Other (See Comments)    Makes pt hyperactive    Social History:  Social History   Socioeconomic History   Marital status: Married    Spouse name: Not on file   Number of children: Not on file   Years of education: Not on file   Highest education level: Not on file   Occupational History   Not on file  Tobacco Use   Smoking status: Former    Packs/day: 3.00    Years: 11.00    Pack years: 33.00    Types: Cigarettes    Quit date: 03/24/1983    Years since quitting: 38.3   Smokeless tobacco: Never  Vaping Use   Vaping Use: Never used  Substance and Sexual Activity   Alcohol use: No   Drug use: No   Sexual activity: Not on file  Other Topics Concern   Not on file  Social History Narrative   Not on file   Social Determinants of Health   Financial Resource Strain: Not on file  Food Insecurity: Not on file  Transportation Needs: Not on file  Physical Activity: Not on file  Stress: Not on file  Social Connections: Not on file  Intimate Partner Violence: Not on file   Social History   Tobacco Use  Smoking Status Former   Packs/day: 3.00   Years: 11.00   Pack years: 33.00   Types: Cigarettes   Quit date: 03/24/1983   Years since quitting: 38.3  Smokeless Tobacco Never   Social History   Substance and Sexual Activity  Alcohol Use No    Family History:  Family History  Problem Relation Age of Onset   Cancer Father        lung    Past medical history, surgical history, medications, allergies, family history and social history reviewed with patient today and changes made to appropriate areas of the chart.   ROS All other ROS negative except what is listed above and in the HPI.      Objective:    There were no vitals taken for this visit.  Wt Readings from Last 3 Encounters:  02/02/21 151 lb 6.4 oz (68.7 kg)  01/26/21 150 lb (68 kg)  10/25/20 149 lb 8 oz (67.8 kg)  Physical Exam  Results for orders placed or performed in visit on 01/26/21  Comp Met (CMET)  Result Value Ref Range   Glucose 81 70 - 99 mg/dL   BUN 19 8 - 27 mg/dL   Creatinine, Ser 0.91 0.57 - 1.00 mg/dL   eGFR 70 >59 mL/min/1.73   BUN/Creatinine Ratio 21 12 - 28   Sodium 135 134 - 144 mmol/L   Potassium 4.5 3.5 - 5.2 mmol/L   Chloride 96 96 - 106  mmol/L   CO2 24 20 - 29 mmol/L   Calcium 9.9 8.7 - 10.3 mg/dL   Total Protein 6.4 6.0 - 8.5 g/dL   Albumin 4.9 (H) 3.8 - 4.8 g/dL   Globulin, Total 1.5 1.5 - 4.5 g/dL   Albumin/Globulin Ratio 3.3 (H) 1.2 - 2.2   Bilirubin Total 0.3 0.0 - 1.2 mg/dL   Alkaline Phosphatase 67 44 - 121 IU/L   AST 17 0 - 40 IU/L   ALT 13 0 - 32 IU/L  Lipid Profile  Result Value Ref Range   Cholesterol, Total 309 (H) 100 - 199 mg/dL   Triglycerides 160 (H) 0 - 149 mg/dL   HDL 89 >39 mg/dL   VLDL Cholesterol Cal 29 5 - 40 mg/dL   LDL Chol Calc (NIH) 191 (H) 0 - 99 mg/dL   Lipid Comment: Comment    Chol/HDL Ratio 3.5 0.0 - 4.4 ratio      Assessment & Plan:   Problem List Items Addressed This Visit       Cardiovascular and Mediastinum   Hypertension - Primary     Other   Depression, recurrent ([place])   Hyperlipidemia     Follow up plan: No follow-ups on file.   LABORATORY TESTING:  - Pap smear: {Blank CZYSAY:30160::"FUX done","not applicable","up to date","done elsewhere"}  IMMUNIZATIONS:   - Tdap: Tetanus vaccination status reviewed: {tetanus status:315746}. - Influenza: {Blank single:19197::"Up to date","Administered today","Postponed to flu season","Refused","Given elsewhere"} - Pneumovax: {Blank single:19197::"Up to date","Administered today","Not applicable","Refused","Given elsewhere"} - Prevnar: {Blank single:19197::"Up to date","Administered today","Not applicable","Refused","Given elsewhere"} - COVID: {Blank single:19197::"Up to date","Administered today","Not applicable","Refused","Given elsewhere"} - HPV: {Blank single:19197::"Up to date","Administered today","Not applicable","Refused","Given elsewhere"} - Shingrix vaccine: {Blank single:19197::"Up to date","Administered today","Not applicable","Refused","Given elsewhere"}  SCREENING: -Mammogram: {Blank single:19197::"Up to date","Ordered today","Not applicable","Refused","Done elsewhere"}  - Colonoscopy: {Blank single:19197::"Up  to date","Ordered today","Not applicable","Refused","Done elsewhere"}  - Bone Density: {Blank single:19197::"Up to date","Ordered today","Not applicable","Refused","Done elsewhere"}  -Hearing Test: {Blank single:19197::"Up to date","Ordered today","Not applicable","Refused","Done elsewhere"}  -Spirometry: {Blank single:19197::"Up to date","Ordered today","Not applicable","Refused","Done elsewhere"}   PATIENT COUNSELING:   Advised to take 1 mg of folate supplement per day if capable of pregnancy.   Sexuality: Discussed sexually transmitted diseases, partner selection, use of condoms, avoidance of unintended pregnancy  and contraceptive alternatives.   Advised to avoid cigarette smoking.  I discussed with the patient that most people either abstain from alcohol or drink within safe limits (<=14/week and <=4 drinks/occasion for males, <=7/weeks and <= 3 drinks/occasion for females) and that the risk for alcohol disorders and other health effects rises proportionally with the number of drinks per week and how often a drinker exceeds daily limits.  Discussed cessation/primary prevention of drug use and availability of treatment for abuse.   Diet: Encouraged to adjust caloric intake to maintain  or achieve ideal body weight, to reduce intake of dietary saturated fat and total fat, to limit sodium intake by avoiding high sodium foods and not adding table salt, and to maintain adequate dietary potassium and calcium preferably from  fresh fruits, vegetables, and low-fat dairy products.    stressed the importance of regular exercise  Injury prevention: Discussed safety belts, safety helmets, smoke detector, smoking near bedding or upholstery.   Dental health: Discussed importance of regular tooth brushing, flossing, and dental visits.    NEXT PREVENTATIVE PHYSICAL DUE IN 1 YEAR. No follow-ups on file.

## 2021-07-27 ENCOUNTER — Encounter: Payer: Commercial Managed Care - PPO | Admitting: Nurse Practitioner

## 2021-07-28 NOTE — Progress Notes (Unsigned)
There were no vitals taken for this visit.   Subjective:    Patient ID: Krista Phelps, female    DOB: March 02, 1955, 66 y.o.   MRN: [id number]  HPI: Krista Phelps is a 66 y.o. female presenting on 07/29/2021 for comprehensive medical examination. Current medical complaints include:{Blank single:19197::"none","***"}  She currently lives with: Menopausal Symptoms: {Blank single:19197::"yes","no"}  HYPERTENSION / [place] Satisfied with current treatment? {Blank single:19197::"yes","no"} Duration of hypertension: {Blank single:19197::"chronic","months","years"} BP monitoring frequency: {Blank single:19197::"not checking","rarely","daily","weekly","monthly","a few times a day","a few times a week","a few times a month"} BP range:  BP medication side effects: {Blank single:19197::"yes","no"} Past BP meds: {Blank QPYPPJKD:32671::"IWPY","KDXIPJASNK","NLZJQBHALP/FXTKWIOXBD","ZHGDJMEQ","ASTMHDQQIW","LNLGXQJJHE/RDEY","CXKGYJEHUD (bystolic)","carvedilol","chlorthalidone","clonidine","diltiazem","exforge HCT","HCTZ","irbesartan (avapro)","labetalol","lisinopril","lisinopril-HCTZ","losartan (cozaar)","methyldopa","nifedipine","olmesartan (benicar)","olmesartan-HCTZ","quinapril","ramipril","spironalactone","tekturna","valsartan","valsartan-HCTZ","verapamil"} Duration of hyperlipidemia: {Blank single:19197::"chronic","months","years"} Cholesterol medication side effects: {Blank single:19197::"yes","no"} Cholesterol supplements: {Blank multiple:19196::"none","fish oil","niacin","red yeast rice"} Past cholesterol medications: {Blank multiple:19196::"none","atorvastain (lipitor)","lovastatin (mevacor)","pravastatin (pravachol)","rosuvastatin (crestor)","simvastatin (zocor)","vytorin","fenofibrate (tricor)","gemfibrozil","ezetimide (zetia)","niaspan","lovaza"} Medication compliance: {Blank single:19197::"excellent compliance","good compliance","fair compliance","poor compliance"} Aspirin: {Blank  single:19197::"yes","no"} Recent stressors: {Blank single:19197::"yes","no"} Recurrent headaches: {Blank single:19197::"yes","no"} Visual changes: {Blank single:19197::"yes","no"} Palpitations: {Blank single:19197::"yes","no"} Dyspnea: {Blank single:19197::"yes","no"} Chest pain: {Blank single:19197::"yes","no"} Lower extremity edema: {Blank single:19197::"yes","no"} Dizzy/lightheaded: {Blank single:19197::"yes","no"}    MOOD   Depression Screen done today and results listed below:     01/26/2021   11:02 AM 10/25/2020   10:46 AM 06/23/2020    9:24 AM 01/14/2020   10:24 AM 04/24/2019    3:52 PM  Depression screen PHQ 2/9  Decreased Interest 3 0 1 3 3   Down, Depressed, Hopeless 1 0 0 2 0  PHQ - 2 Score 4 0 1 5 3   Altered sleeping 3 3 3 3  0  Tired, decreased energy 3 0 3 3 3   Change in appetite 3 0 3 1 3   Feeling bad or failure about yourself  0 0 0 0 1  Trouble concentrating 0 0 0 0 0  Moving slowly or fidgety/restless 0 0 3 0 0  Suicidal thoughts 0 0 0 0 0  PHQ-9 Score 13 3 13 12 10   Difficult doing work/chores Somewhat difficult Not difficult at all Not difficult at all  Not difficult at all    The patient {has/does not have:19849} a history of falls. I {did/did not:19850} complete a risk assessment for falls. A plan of care for falls {was/was not:19852} documented.   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   aspirin 81 MG tablet Take 81 mg by mouth daily.   Budeson-Glycopyrrol-Formoterol (BREZTRI AEROSPHERE) 160-9-4.8 MCG/ACT AERO Inhale 2 puffs into the lungs in the morning and at bedtime.   buPROPion (WELLBUTRIN XL) 300 MG 24 hr tablet Take 1 tablet by mouth once daily   calcium  carbonate (TUMS - DOSED IN MG ELEMENTAL CALCIUM) 500 MG chewable tablet Chew 1 tablet by mouth daily as needed for indigestion or heartburn.    estradiol (ESTRACE VAGINAL) 0.1 MG/GM vaginal cream Place 1 Applicatorful vaginally at bedtime as needed.   fluticasone (FLONASE) 50 MCG/ACT nasal spray Place 2 sprays into both nostrils in the morning and at bedtime.   Homeopathic Products (LEG CRAMPS PO) Take 4 tablets by mouth daily.   Magnesium 250 MG TABS Take 1,000 mg by mouth daily as needed (for cramps).   Melatonin 10 MG TABS Take 10 mg by mouth at bedtime.    meloxicam (MOBIC) 15 MG tablet Take 1 tablet by mouth once daily   Multiple Vitamin (MULTIVITAMIN) tablet Take 1 tablet by mouth daily.   Multiple Vitamins-Minerals (PRESERVISION AREDS 2 PO) Take 2 tablets by mouth daily.   pantoprazole (PROTONIX) 40 MG tablet Take 1 tablet by mouth twice daily as needed   rosuvastatin (CRESTOR) 5 MG tablet Take 1 tablet (5 mg total) by mouth daily.   sertraline (ZOLOFT) 100 MG tablet TAKE ONE AND ONE-HALF TABLETS BY MOUTH ONCE DAILY   No current facility-administered medications on file prior to visit.    Allergies:  Allergies  Allergen Reactions   Atropine Other (See Comments)    Makes pt hyperactive    Social History:  Social History   Socioeconomic History   Marital status: Married    Spouse name: Not on file   Number of children: Not on file   Years of education: Not on file   Highest education level: Not  on file  Occupational History   Not on file  Tobacco Use   Smoking status: Former    Packs/day: 3.00    Years: 11.00    Pack years: 33.00    Types: Cigarettes    Quit date: 03/24/1983    Years since quitting: 38.3   Smokeless tobacco: Never  Vaping Use   Vaping Use: Never used  Substance and Sexual Activity   Alcohol use: No   Drug use: No   Sexual activity: Not on file  Other Topics Concern   Not on file  Social History Narrative   Not on file   Social Determinants of Health   Financial Resource Strain: Not on file  Food Insecurity: Not on file  Transportation Needs: Not on file  Physical Activity: Not on file  Stress: Not on file  Social Connections: Not on file  Intimate Partner Violence: Not on file   Social History   Tobacco Use  Smoking Status Former   Packs/day: 3.00   Years: 11.00   Pack years: 33.00   Types: Cigarettes   Quit date: 03/24/1983   Years since quitting: 38.3  Smokeless Tobacco Never   Social History   Substance and Sexual Activity  Alcohol Use No    Family History:  Family History  Problem Relation Age of Onset   Cancer Father        lung    Past medical history, surgical history, medications, allergies, family history and social history reviewed with patient today and changes made to appropriate areas of the chart.   ROS All other ROS negative except what is listed above and in the HPI.      Objective:    There were no vitals taken for this visit.  Wt Readings from Last 3 Encounters:  02/02/21 151 lb 6.4 oz (68.7 kg)  01/26/21 150 lb (68 kg)  10/25/20 149 lb 8  oz (67.8 kg)    Physical Exam  Results for orders placed or performed in visit on 01/26/21  Comp Met (CMET)  Result Value Ref Range   Glucose 81 70 - 99 mg/dL   BUN 19 8 - 27 mg/dL   Creatinine, Ser 0.91 0.57 - 1.00 mg/dL   eGFR 70 >59 mL/min/1.73   BUN/Creatinine Ratio 21 12 - 28   Sodium 135 134 - 144 mmol/L   Potassium 4.5 3.5 - 5.2 mmol/L   Chloride 96  96 - 106 mmol/L   CO2 24 20 - 29 mmol/L   Calcium 9.9 8.7 - 10.3 mg/dL   Total Protein 6.4 6.0 - 8.5 g/dL   Albumin 4.9 (H) 3.8 - 4.8 g/dL   Globulin, Total 1.5 1.5 - 4.5 g/dL   Albumin/Globulin Ratio 3.3 (H) 1.2 - 2.2   Bilirubin Total 0.3 0.0 - 1.2 mg/dL   Alkaline Phosphatase 67 44 - 121 IU/L   AST 17 0 - 40 IU/L   ALT 13 0 - 32 IU/L  Lipid Profile  Result Value Ref Range   Cholesterol, Total 309 (H) 100 - 199 mg/dL   Triglycerides 160 (H) 0 - 149 mg/dL   HDL 89 >39 mg/dL   VLDL Cholesterol Cal 29 5 - 40 mg/dL   LDL Chol Calc (NIH) 191 (H) 0 - 99 mg/dL   Lipid Comment: Comment    Chol/HDL Ratio 3.5 0.0 - 4.4 ratio      Assessment & Plan:   Problem List Items Addressed This Visit       Cardiovascular and Mediastinum   Hypertension - Primary     Other   Depression, recurrent ([place])   Hyperlipidemia     Follow up plan: No follow-ups on file.   LABORATORY TESTING:  - Pap smear: {Blank TIWPYK:99833::"ASN done","not applicable","up to date","done elsewhere"}  IMMUNIZATIONS:   - Tdap: Tetanus vaccination status reviewed: {tetanus status:315746}. - Influenza: {Blank single:19197::"Up to date","Administered today","Postponed to flu season","Refused","Given elsewhere"} - Pneumovax: {Blank single:19197::"Up to date","Administered today","Not applicable","Refused","Given elsewhere"} - Prevnar: {Blank single:19197::"Up to date","Administered today","Not applicable","Refused","Given elsewhere"} - COVID: {Blank single:19197::"Up to date","Administered today","Not applicable","Refused","Given elsewhere"} - HPV: {Blank single:19197::"Up to date","Administered today","Not applicable","Refused","Given elsewhere"} - Shingrix vaccine: {Blank single:19197::"Up to date","Administered today","Not applicable","Refused","Given elsewhere"}  SCREENING: -Mammogram: {Blank single:19197::"Up to date","Ordered today","Not applicable","Refused","Done elsewhere"}  - Colonoscopy: {Blank  single:19197::"Up to date","Ordered today","Not applicable","Refused","Done elsewhere"}  - Bone Density: {Blank single:19197::"Up to date","Ordered today","Not applicable","Refused","Done elsewhere"}  -Hearing Test: {Blank single:19197::"Up to date","Ordered today","Not applicable","Refused","Done elsewhere"}  -Spirometry: {Blank single:19197::"Up to date","Ordered today","Not applicable","Refused","Done elsewhere"}   PATIENT COUNSELING:   Advised to take 1 mg of folate supplement per day if capable of pregnancy.   Sexuality: Discussed sexually transmitted diseases, partner selection, use of condoms, avoidance of unintended pregnancy  and contraceptive alternatives.   Advised to avoid cigarette smoking.  I discussed with the patient that most people either abstain from alcohol or drink within safe limits (<=14/week and <=4 drinks/occasion for males, <=7/weeks and <= 3 drinks/occasion for females) and that the risk for alcohol disorders and other health effects rises proportionally with the number of drinks per week and how often a drinker exceeds daily limits.  Discussed cessation/primary prevention of drug use and availability of treatment for abuse.   Diet: Encouraged to adjust caloric intake to maintain  or achieve ideal body weight, to reduce intake of dietary saturated fat and total fat, to limit sodium intake by avoiding high sodium foods and not adding table salt, and to maintain adequate  dietary potassium and calcium preferably from fresh fruits, vegetables, and low-fat dairy products.    stressed the importance of regular exercise  Injury prevention: Discussed safety belts, safety helmets, smoke detector, smoking near bedding or upholstery.   Dental health: Discussed importance of regular tooth brushing, flossing, and dental visits.    NEXT PREVENTATIVE PHYSICAL DUE IN 1 YEAR. No follow-ups on file.

## 2021-07-29 ENCOUNTER — Encounter: Payer: Self-pay | Admitting: Nurse Practitioner

## 2021-07-29 ENCOUNTER — Ambulatory Visit (INDEPENDENT_AMBULATORY_CARE_PROVIDER_SITE_OTHER): Payer: Commercial Managed Care - PPO | Admitting: Nurse Practitioner

## 2021-07-29 VITALS — BP 137/89 | HR 80 | Temp 97.9°F | Ht 62.21 in | Wt 152.6 lb

## 2021-07-29 DIAGNOSIS — Z1231 Encounter for screening mammogram for malignant neoplasm of breast: Secondary | ICD-10-CM

## 2021-07-29 DIAGNOSIS — Z1211 Encounter for screening for malignant neoplasm of colon: Secondary | ICD-10-CM

## 2021-07-29 DIAGNOSIS — F339 Major depressive disorder, recurrent, unspecified: Secondary | ICD-10-CM | POA: Diagnosis not present

## 2021-07-29 DIAGNOSIS — Z0000 Encounter for general adult medical examination without abnormal findings: Secondary | ICD-10-CM

## 2021-07-29 DIAGNOSIS — E782 Mixed hyperlipidemia: Secondary | ICD-10-CM

## 2021-07-29 DIAGNOSIS — I10 Essential (primary) hypertension: Secondary | ICD-10-CM

## 2021-07-29 LAB — URINALYSIS, ROUTINE W REFLEX MICROSCOPIC
Bilirubin, UA: NEGATIVE
Glucose, UA: NEGATIVE
Ketones, UA: NEGATIVE
Leukocytes,UA: NEGATIVE
Nitrite, UA: NEGATIVE
Protein,UA: NEGATIVE
RBC, UA: NEGATIVE
Specific Gravity, UA: >1.03 — ABNORMAL HIGH (ref 1.005–1.030)
Urobilinogen, Ur: 0.2 mg/dL (ref 0.2–1.0)
pH, UA: 5.5 (ref 5.0–7.5)

## 2021-07-29 MED ORDER — ROSUVASTATIN CALCIUM 10 MG PO TABS: 10.0000 mg | ORAL_TABLET | Freq: Every day | ORAL | 1 refills | Status: DC

## 2021-07-29 MED ORDER — MELOXICAM 15 MG PO TABS: 15.0000 mg | ORAL_TABLET | Freq: Every day | ORAL | 1 refills | Status: DC

## 2021-07-29 MED ORDER — PANTOPRAZOLE SODIUM 40 MG PO TBEC: DELAYED_RELEASE_TABLET | ORAL | 1 refills | Status: DC

## 2021-07-29 MED ORDER — AMLODIPINE BESYLATE 10 MG PO TABS: 10.0000 mg | ORAL_TABLET | Freq: Every day | ORAL | 1 refills | Status: DC

## 2021-07-29 MED ORDER — BUPROPION HCL ER (XL) 300 MG PO TB24: 300.0000 mg | ORAL_TABLET | Freq: Every day | ORAL | 1 refills | Status: DC

## 2021-07-29 MED ORDER — SERTRALINE HCL 100 MG PO TABS: 200.0000 mg | ORAL_TABLET | Freq: Every day | ORAL | 1 refills | Status: DC

## 2021-07-29 NOTE — Assessment & Plan Note (Signed)
Chronic.  Not well controlled.  Will increase Amlodipine to '10mg'$ .  Labs ordered today.  Return to clinic in 1 months for reevaluation.  Call sooner if concerns arise.

## 2021-07-29 NOTE — Assessment & Plan Note (Signed)
Chronic.  Controlled.  Will increase dose from Crestor '5mg'$  to '10mg'$  to maximize effects.  Labs ordered today.  Return to clinic in 6 months for reevaluation.  Call sooner if concerns arise.

## 2021-07-29 NOTE — Assessment & Plan Note (Signed)
Chronic.  Not well controlled.  Will increase Zoloft to '200mg'$  daily.  Continue with Wellbutrin '300mg'$  daily.  Labs ordered today.  Return to clinic in 1 months for reevaluation.  Call sooner if concerns arise.

## 2021-07-30 LAB — CBC WITH DIFFERENTIAL/PLATELET
Basophils Absolute: 0.1 10*3/uL (ref 0.0–0.2)
Basos: 1 %
EOS (ABSOLUTE): 0.3 10*3/uL (ref 0.0–0.4)
Eos: 4 %
Hematocrit: 46.3 % (ref 34.0–46.6)
Hemoglobin: 14.9 g/dL (ref 11.1–15.9)
Immature Grans (Abs): 0 10*3/uL (ref 0.0–0.1)
Immature Granulocytes: 0 %
Lymphocytes Absolute: 1.8 10*3/uL (ref 0.7–3.1)
Lymphs: 29 %
MCH: 29.8 pg (ref 26.6–33.0)
MCHC: 32.2 g/dL (ref 31.5–35.7)
MCV: 93 fL (ref 79–97)
Monocytes Absolute: 0.5 10*3/uL (ref 0.1–0.9)
Monocytes: 8 %
Neutrophils Absolute: 3.6 10*3/uL (ref 1.4–7.0)
Neutrophils: 58 %
Platelets: 237 10*3/uL (ref 150–450)
RBC: 5 x10E6/uL (ref 3.77–5.28)
RDW: 13.3 % (ref 11.7–15.4)
WBC: 6.2 10*3/uL (ref 3.4–10.8)

## 2021-07-30 LAB — COMPREHENSIVE METABOLIC PANEL
ALT: 15 IU/L (ref 0–32)
AST: 16 IU/L (ref 0–40)
Albumin/Globulin Ratio: 2.5 — ABNORMAL HIGH (ref 1.2–2.2)
Albumin: 4.9 g/dL — ABNORMAL HIGH (ref 3.8–4.8)
Alkaline Phosphatase: 78 IU/L (ref 44–121)
BUN/Creatinine Ratio: 21 (ref 12–28)
BUN: 21 mg/dL (ref 8–27)
Bilirubin Total: 0.3 mg/dL (ref 0.0–1.2)
CO2: 26 mmol/L (ref 20–29)
Calcium: 10.1 mg/dL (ref 8.7–10.3)
Chloride: 105 mmol/L (ref 96–106)
Creatinine, Ser: 0.99 mg/dL (ref 0.57–1.00)
Globulin, Total: 2 g/dL (ref 1.5–4.5)
Glucose: 90 mg/dL (ref 70–99)
Potassium: 4.4 mmol/L (ref 3.5–5.2)
Sodium: 144 mmol/L (ref 134–144)
Total Protein: 6.9 g/dL (ref 6.0–8.5)
eGFR: 62 mL/min/{1.73_m2} (ref >59)

## 2021-07-30 LAB — LIPID PANEL
Chol/HDL Ratio: 2.4 ratio (ref 0.0–4.4)
Cholesterol, Total: 241 mg/dL — ABNORMAL HIGH (ref 100–199)
HDL: 102 mg/dL (ref >39)
LDL Chol Calc (NIH): 126 mg/dL — ABNORMAL HIGH (ref 0–99)
Triglycerides: 80 mg/dL (ref 0–149)
VLDL Cholesterol Cal: 13 mg/dL (ref 5–40)

## 2021-07-30 LAB — TSH: TSH: 1.84 u[IU]/mL (ref 0.450–4.500)

## 2021-07-31 NOTE — Progress Notes (Signed)
HI Nimra.  It was good to see you last week.  Your lab work looks good.  Your cholesterol has improved from prior.  Continue with your current medication regimen.  Follow up as discussed.  Please let me know if you have any questions.

## 2021-08-24 ENCOUNTER — Other Ambulatory Visit: Payer: Self-pay

## 2021-08-24 ENCOUNTER — Telehealth: Payer: Self-pay

## 2021-08-24 DIAGNOSIS — Z1211 Encounter for screening for malignant neoplasm of colon: Secondary | ICD-10-CM

## 2021-08-24 LAB — COLOGUARD: COLOGUARD: POSITIVE — AB

## 2021-08-24 MED ORDER — NA SULFATE-K SULFATE-MG SULF 17.5-3.13-1.6 GM/177ML PO SOLN: 1.0000 | Freq: Once | ORAL | 0 refills | Status: AC

## 2021-08-24 NOTE — Progress Notes (Signed)
Please let patient know that her Cologuard was positive.  I highly recommend she get a colonoscopy.  I have placed the order for this.  Please let me know if she has any questions.

## 2021-08-24 NOTE — Addendum Note (Signed)
Addended by: Jon Billings on: 08/24/2021 07:48 AM   Modules accepted: Orders

## 2021-08-24 NOTE — Telephone Encounter (Signed)
Gastroenterology Pre-Procedure Review  Request Date: 09/13/21 Requesting Physician: Dr. Vicente Males  PATIENT REVIEW QUESTIONS: The patient responded to the following health history questions as indicated:    1. Are you having any GI issues? no 2. Do you have a personal history of Polyps? no 3. Do you have a family history of Colon Cancer or Polyps? yes (Mother colon polyps) 4. Diabetes Mellitus? no 5. Joint replacements in the past 12 months?no 6. Major health problems in the past 3 months?no 7. Any artificial heart valves, MVP, or defibrillator?no    MEDICATIONS & ALLERGIES:    Patient reports the following regarding taking any anticoagulation/antiplatelet therapy:   Plavix, Coumadin, Eliquis, Xarelto, Lovenox, Pradaxa, Brilinta, or Effient? no Aspirin? yes ('81mg'$  daily)  Patient confirms/reports the following medications:  Current Outpatient Medications  Medication Sig Dispense Refill   amLODipine (NORVASC) 10 MG tablet Take 1 tablet (10 mg total) by mouth daily. 90 tablet 1   aspirin 81 MG tablet Take 81 mg by mouth daily.     Budeson-Glycopyrrol-Formoterol (BREZTRI AEROSPHERE) 160-9-4.8 MCG/ACT AERO Inhale 2 puffs into the lungs in the morning and at bedtime. (Patient not taking: Reported on 07/29/2021) 5.9 g 0   buPROPion (WELLBUTRIN XL) 300 MG 24 hr tablet Take 1 tablet (300 mg total) by mouth daily. 90 tablet 1   calcium carbonate (TUMS - DOSED IN MG ELEMENTAL CALCIUM) 500 MG chewable tablet Chew 1 tablet by mouth daily as needed for indigestion or heartburn.      estradiol (ESTRACE VAGINAL) 0.1 MG/GM vaginal cream Place 1 Applicatorful vaginally at bedtime as needed. 42.5 g 3   Homeopathic Products (LEG CRAMPS PO) Take 4 tablets by mouth daily.     Magnesium 250 MG TABS Take 1,000 mg by mouth daily as needed (for cramps).     Melatonin 10 MG TABS Take 10 mg by mouth at bedtime.      meloxicam (MOBIC) 15 MG tablet Take 1 tablet (15 mg total) by mouth daily. 90 tablet 1   Multiple  Vitamins-Minerals (PRESERVISION AREDS 2 PO) Take 2 tablets by mouth daily.     pantoprazole (PROTONIX) 40 MG tablet Take 1 tablet by mouth twice daily as needed 180 tablet 1   rosuvastatin (CRESTOR) 10 MG tablet Take 1 tablet (10 mg total) by mouth daily. 90 tablet 1   sertraline (ZOLOFT) 100 MG tablet Take 2 tablets (200 mg total) by mouth daily. 180 tablet 1   No current facility-administered medications for this visit.    Patient confirms/reports the following allergies:  Allergies  Allergen Reactions   Atropine Other (See Comments)    Makes pt hyperactive    No orders of the defined types were placed in this encounter.   AUTHORIZATION INFORMATION Primary Insurance: 1D#: Group #:  Secondary Insurance: 1D#: Group #:  SCHEDULE INFORMATION: Date: 09/13/21 Time: Location: [facility]

## 2021-08-29 NOTE — Progress Notes (Unsigned)
There were no vitals taken for this visit.   Subjective:    Patient ID: Krista Phelps, female    DOB: [birth year], 66 y.o.   MRN: [id number]  HPI: Krista Phelps is a 66 y.o. female  No chief complaint on file.  HYPERTENSION Patient has been out of medication for about a week.  Hypertension status: controlled  Satisfied with current treatment? no Duration of hypertension: years BP monitoring frequency:  daily BP range: 125/65 BP medication side effects:  no Medication compliance: excellent compliance Previous BP meds:amlodipine Aspirin: no Recurrent headaches: no Visual changes: no Palpitations: no Dyspnea: no Chest pain: no Lower extremity edema: no Dizzy/lightheaded: no    Relevant past medical, surgical, family and social history reviewed and updated as indicated. Interim medical history since our last visit reviewed. Allergies and medications reviewed and updated.  Review of Systems  Eyes:  Negative for visual disturbance.  Respiratory:  Negative for cough, chest tightness and shortness of breath.   Cardiovascular:  Negative for chest pain, palpitations and leg swelling.  Neurological:  Negative for dizziness and headaches.  Psychiatric/Behavioral:  Positive for dysphoric mood. Negative for suicidal ideas. The patient is not nervous/anxious.     Per HPI unless specifically indicated above     Objective:    There were no vitals taken for this visit.  Wt Readings from Last 3 Encounters:  07/29/21 152 lb 9.6 oz (69.2 kg)  02/02/21 151 lb 6.4 oz (68.7 kg)  01/26/21 150 lb (68 kg)    Physical Exam Vitals and nursing note reviewed.  Constitutional:      General: She is not in acute distress.    Appearance: Normal appearance. She is normal weight. She is not ill-appearing, toxic-appearing or diaphoretic.  HENT:     Head: Normocephalic.     Right Ear: External ear normal.     Left Ear: External ear normal.     Nose: Nose normal.     Mouth/Throat:      Mouth: Mucous membranes are moist.     Pharynx: Oropharynx is clear.  Eyes:     General:        Right eye: No discharge.        Left eye: No discharge.     Extraocular Movements: Extraocular movements intact.     Conjunctiva/sclera: Conjunctivae normal.     Pupils: Pupils are equal, round, and reactive to light.  Cardiovascular:     Rate and Rhythm: Normal rate and regular rhythm.     Heart sounds: No murmur heard. Pulmonary:     Effort: Pulmonary effort is normal. No respiratory distress.     Breath sounds: Normal breath sounds. No wheezing or rales.  Musculoskeletal:     Cervical back: Normal range of motion and neck supple.  Skin:    General: Skin is warm and dry.     Capillary Refill: Capillary refill takes less than 2 seconds.  Neurological:     General: No focal deficit present.     Mental Status: She is alert and oriented to person, place, and time. Mental status is at baseline.  Psychiatric:        Mood and Affect: Mood normal.        Behavior: Behavior normal.        Thought Content: Thought content normal.        Judgment: Judgment normal.     Results for orders placed or performed in visit on 07/29/21  CBC with Differential/Platelet  Result Value Ref Range   WBC 6.2 3.4 - 10.8 x10E3/uL   RBC 5.00 3.77 - 5.28 x10E6/uL   Hemoglobin 14.9 11.1 - 15.9 g/dL   Hematocrit 46.3 34.0 - 46.6 %   MCV 93 79 - 97 fL   MCH 29.8 26.6 - 33.0 pg   MCHC 32.2 31.5 - 35.7 g/dL   RDW 13.3 11.7 - 15.4 %   Platelets 237 150 - 450 x10E3/uL   Neutrophils 58 Not Estab. %   Lymphs 29 Not Estab. %   Monocytes 8 Not Estab. %   Eos 4 Not Estab. %   Basos 1 Not Estab. %   Neutrophils Absolute 3.6 1.4 - 7.0 x10E3/uL   Lymphocytes Absolute 1.8 0.7 - 3.1 x10E3/uL   Monocytes Absolute 0.5 0.1 - 0.9 x10E3/uL   EOS (ABSOLUTE) 0.3 0.0 - 0.4 x10E3/uL   Basophils Absolute 0.1 0.0 - 0.2 x10E3/uL   Immature Granulocytes 0 Not Estab. %   Immature Grans (Abs) 0.0 0.0 - 0.1 x10E3/uL   Comprehensive metabolic panel  Result Value Ref Range   Glucose 90 70 - 99 mg/dL   BUN 21 8 - 27 mg/dL   Creatinine, Ser 0.99 0.57 - 1.00 mg/dL   eGFR 62 >59 mL/min/1.73   BUN/Creatinine Ratio 21 12 - 28   Sodium 144 134 - 144 mmol/L   Potassium 4.4 3.5 - 5.2 mmol/L   Chloride 105 96 - 106 mmol/L   CO2 26 20 - 29 mmol/L   Calcium 10.1 8.7 - 10.3 mg/dL   Total Protein 6.9 6.0 - 8.5 g/dL   Albumin 4.9 (H) 3.8 - 4.8 g/dL   Globulin, Total 2.0 1.5 - 4.5 g/dL   Albumin/Globulin Ratio 2.5 (H) 1.2 - 2.2   Bilirubin Total 0.3 0.0 - 1.2 mg/dL   Alkaline Phosphatase 78 44 - 121 IU/L   AST 16 0 - 40 IU/L   ALT 15 0 - 32 IU/L  Lipid panel  Result Value Ref Range   Cholesterol, Total 241 (H) 100 - 199 mg/dL   Triglycerides 80 0 - 149 mg/dL   HDL 102 >39 mg/dL   VLDL Cholesterol Cal 13 5 - 40 mg/dL   LDL Chol Calc (NIH) 126 (H) 0 - 99 mg/dL   Chol/HDL Ratio 2.4 0.0 - 4.4 ratio  TSH  Result Value Ref Range   TSH 1.840 0.450 - 4.500 uIU/mL  Urinalysis, Routine w reflex microscopic  Result Value Ref Range   Specific Gravity, UA >1.030 (H) 1.005 - 1.030   pH, UA 5.5 5.0 - 7.5   Color, UA Yellow Yellow   Appearance Ur Clear Clear   Leukocytes,UA Negative Negative   Protein,UA Negative Negative/Trace   Glucose, UA Negative Negative   Ketones, UA Negative Negative   RBC, UA Negative Negative   Bilirubin, UA Negative Negative   Urobilinogen, Ur 0.2 0.2 - 1.0 mg/dL   Nitrite, UA Negative Negative  Cologuard  Result Value Ref Range   COLOGUARD Positive (A) Negative      Assessment & Plan:   Problem List Items Addressed This Visit       Cardiovascular and Mediastinum   Hypertension     Other   Depression, recurrent ([place])   Hyperlipidemia - Primary     Follow up plan: No follow-ups on file.

## 2021-08-31 ENCOUNTER — Ambulatory Visit: Payer: Commercial Managed Care - PPO | Admitting: Nurse Practitioner

## 2021-08-31 ENCOUNTER — Encounter: Payer: Self-pay | Admitting: Nurse Practitioner

## 2021-08-31 VITALS — BP 131/78 | HR 72 | Temp 98.8°F | Wt 156.8 lb

## 2021-08-31 DIAGNOSIS — I10 Essential (primary) hypertension: Secondary | ICD-10-CM

## 2021-08-31 NOTE — Assessment & Plan Note (Addendum)
Chronic.  Controlled.  Continue with current medication regimen of Amlodipine '10mg'$ .  Labs ordered today.  Return to clinic in 3 months for reevaluation.  Call sooner if concerns arise.

## 2021-09-01 ENCOUNTER — Telehealth: Payer: Self-pay

## 2021-09-01 NOTE — Telephone Encounter (Signed)
Patient contacted office to request date clarification.  Confirmed that her procedure date is 09/13/21.  She also wanted to know if she should take her antibiotic prior to her colonoscopy.  Advised her to do as advised by her Ortho Surgeon-as she had a L. Hip Replacement a few years ago.  She said Dr. Kyla Balzarine had advised prior to having medical procedures to take (4) 500 mg of Amoxicillin.   Informed her that I will make note of this.  Thanks,  Sharyn Lull

## 2021-09-03 ENCOUNTER — Other Ambulatory Visit: Payer: Self-pay | Admitting: Nurse Practitioner

## 2021-09-05 NOTE — Telephone Encounter (Signed)
Requested medication (s) are due for refill today:no   Requested medication (s) are on the active medication list:yes  Last refill:  07/29/21  Future visit scheduled: yes  Notes to clinic:  Unable to refill per protocol, Rx request is too soon. Last refill 07/29/21 for 90 days and 1 refill.     Requested Prescriptions  Pending Prescriptions Disp Refills   sertraline (ZOLOFT) 100 MG tablet [Pharmacy Med Name: Sertraline HCl 100 MG Oral Tablet] 135 tablet 0    Sig: TAKE 1 & 1/2 (ONE & ONE-HALF) TABLETS BY MOUTH ONCE DAILY     Psychiatry:  Antidepressants - SSRI - sertraline Passed - 09/03/2021  6:21 PM      Passed - AST in normal range and within 360 days    AST  Date Value Ref Range Status  07/29/2021 16 0 - 40 IU/L Final         Passed - ALT in normal range and within 360 days    ALT  Date Value Ref Range Status  07/29/2021 15 0 - 32 IU/L Final         Passed - Completed PHQ-2 or PHQ-9 in the last 360 days      Passed - Valid encounter within last 6 months    Recent Outpatient Visits           5 days ago Primary hypertension   [facility] Jon Billings, NP   1 month ago Annual physical exam   [facility] Jon Billings, NP   7 months ago Primary hypertension   [facility] Jon Billings, NP   10 months ago Primary hypertension   [facility] Jon Billings, NP   1 year ago Cough   [facility] Jon Billings, NP       Future Appointments             In 2 months Jon Billings, NP [facility], [place]

## 2021-09-13 ENCOUNTER — Ambulatory Visit: Payer: Commercial Managed Care - PPO | Admitting: Anesthesiology

## 2021-09-13 ENCOUNTER — Encounter
Admission: RE | Disposition: A | Discharge status: Home / Self Care | Payer: Self-pay | Source: Home / Self Care | Attending: Gastroenterology

## 2021-09-13 ENCOUNTER — Ambulatory Visit
Admission: RE | Admit: 2021-09-13 | Discharge: 2021-09-13 | Disposition: A | Discharge status: Home / Self Care | Payer: Commercial Managed Care - PPO | Attending: Gastroenterology | Admitting: Gastroenterology

## 2021-09-13 ENCOUNTER — Other Ambulatory Visit: Payer: Self-pay

## 2021-09-13 DIAGNOSIS — D124 Benign neoplasm of descending colon: Secondary | ICD-10-CM | POA: Diagnosis not present

## 2021-09-13 DIAGNOSIS — D126 Benign neoplasm of colon, unspecified: Secondary | ICD-10-CM | POA: Diagnosis not present

## 2021-09-13 DIAGNOSIS — K5730 Diverticulosis of large intestine without perforation or abscess without bleeding: Secondary | ICD-10-CM | POA: Insufficient documentation

## 2021-09-13 DIAGNOSIS — Z9049 Acquired absence of other specified parts of digestive tract: Secondary | ICD-10-CM | POA: Insufficient documentation

## 2021-09-13 DIAGNOSIS — K219 Gastro-esophageal reflux disease without esophagitis: Secondary | ICD-10-CM | POA: Insufficient documentation

## 2021-09-13 DIAGNOSIS — M1990 Unspecified osteoarthritis, unspecified site: Secondary | ICD-10-CM | POA: Insufficient documentation

## 2021-09-13 DIAGNOSIS — D120 Benign neoplasm of cecum: Secondary | ICD-10-CM | POA: Diagnosis not present

## 2021-09-13 DIAGNOSIS — Z96642 Presence of left artificial hip joint: Secondary | ICD-10-CM | POA: Diagnosis not present

## 2021-09-13 DIAGNOSIS — F32A Depression, unspecified: Secondary | ICD-10-CM | POA: Insufficient documentation

## 2021-09-13 DIAGNOSIS — G4730 Sleep apnea, unspecified: Secondary | ICD-10-CM | POA: Insufficient documentation

## 2021-09-13 DIAGNOSIS — Z1211 Encounter for screening for malignant neoplasm of colon: Secondary | ICD-10-CM | POA: Insufficient documentation

## 2021-09-13 DIAGNOSIS — I10 Essential (primary) hypertension: Secondary | ICD-10-CM | POA: Insufficient documentation

## 2021-09-13 DIAGNOSIS — Z87891 Personal history of nicotine dependence: Secondary | ICD-10-CM | POA: Diagnosis not present

## 2021-09-13 HISTORY — PX: COLONOSCOPY WITH PROPOFOL: SHX5780

## 2021-09-13 SURGERY — COLONOSCOPY WITH PROPOFOL
Anesthesia: General

## 2021-09-13 MED ORDER — LIDOCAINE HCL (CARDIAC) PF 100 MG/5ML IV SOSY
PREFILLED_SYRINGE | INTRAVENOUS | Status: DC | PRN
  Administered 2021-09-13: 100 mg via INTRAVENOUS

## 2021-09-13 MED ORDER — PROPOFOL 10 MG/ML IV BOLUS
INTRAVENOUS | Status: DC | PRN
  Administered 2021-09-13: 20 mg via INTRAVENOUS
  Administered 2021-09-13: 50 mg via INTRAVENOUS
  Administered 2021-09-13: 20 mg via INTRAVENOUS

## 2021-09-13 MED ORDER — GLYCOPYRROLATE 0.2 MG/ML IJ SOLN
INTRAMUSCULAR | Status: DC | PRN
  Administered 2021-09-13: .2 mg via INTRAVENOUS

## 2021-09-13 MED ORDER — PROPOFOL 500 MG/50ML IV EMUL
INTRAVENOUS | Status: DC | PRN
  Administered 2021-09-13: 155 ug/kg/min via INTRAVENOUS

## 2021-09-13 MED ORDER — SODIUM CHLORIDE 0.9 % IV SOLN: INTRAVENOUS | Status: DC

## 2021-09-13 NOTE — Transfer of Care (Signed)
Immediate Anesthesia Transfer of Care Note  Patient: KAITYLN KALLSTROM  Procedure(s) Performed: COLONOSCOPY WITH PROPOFOL  Patient Location: Endoscopy Unit  Anesthesia Type:General  Level of Consciousness: awake, drowsy and patient cooperative  Airway & Oxygen Therapy: Patient Spontanous Breathing and Patient connected to face mask oxygen  Post-op Assessment: Report given to RN and Post -op Vital signs reviewed and stable  Post vital signs: Reviewed and stable  Last Vitals:  Vitals Value Taken Time  BP 109/69 09/13/21 0828  Temp    Pulse 96 09/13/21 0828  Resp 20 09/13/21 0828  SpO2 100 % 09/13/21 0828    Last Pain:  Vitals:   09/13/21 0828  TempSrc:   PainSc: Asleep         Complications: No notable events documented.

## 2021-09-13 NOTE — Anesthesia Postprocedure Evaluation (Signed)
Anesthesia Post Note  Patient: Krista Phelps  Procedure(s) Performed: COLONOSCOPY WITH PROPOFOL  Patient location during evaluation: Endoscopy Anesthesia Type: General Level of consciousness: awake and alert Pain management: pain level controlled Vital Signs Assessment: post-procedure vital signs reviewed and stable Respiratory status: spontaneous breathing, nonlabored ventilation and respiratory function stable Cardiovascular status: blood pressure returned to baseline and stable Postop Assessment: no apparent nausea or vomiting Anesthetic complications: no   No notable events documented.   Last Vitals:  Vitals:   09/13/21 0838 09/13/21 0848  BP: 116/79 136/86  Pulse: 84 76  Resp: 20 15  Temp: (!) 36.1 C   SpO2: 96% 96%    Last Pain:  Vitals:   09/13/21 0848  TempSrc:   PainSc: 0-No pain                 [place] Ouch

## 2021-09-13 NOTE — Anesthesia Procedure Notes (Signed)
Procedure Name: General with mask airway Date/Time: 09/13/2021 8:19 AM  Performed by: Kelton Pillar, CRNAPre-anesthesia Checklist: Patient identified, Emergency Drugs available, Suction available and Patient being monitored Patient Re-evaluated:Patient Re-evaluated prior to induction Oxygen Delivery Method: Simple face mask Induction Type: IV induction Placement Confirmation: positive ETCO2, CO2 detector and breath sounds checked- equal and bilateral Dental Injury: Teeth and Oropharynx as per pre-operative assessment

## 2021-09-13 NOTE — Anesthesia Preprocedure Evaluation (Addendum)
Anesthesia Evaluation  Patient identified by MRN, date of birth, ID band Patient awake    Reviewed: Allergy & Precautions, NPO status , Patient's Chart, lab work & pertinent test results  Airway Mallampati: II  TM Distance: >3 FB Neck ROM: full    Dental no notable dental hx.    Pulmonary sleep apnea and Continuous Positive Airway Pressure Ventilation , former smoker,    Pulmonary exam normal        Cardiovascular hypertension, Pt. 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 No recent symptoms of laryngitis  Past Medical History: No date: Depression No date: GERD (gastroesophageal reflux disease) No date: Meningioma (HCC) No date: Night sweats No date: Osteoarthritis No date: Reflux No date: Sinus problem  Past Surgical History: No date: APPENDECTOMY No date: CHOLECYSTECTOMY No date: plantar wart removed No date: TONSILLECTOMY AND ADENOIDECTOMY 04/02/2018: TOTAL HIP ARTHROPLASTY; Left     Comment:  Procedure: TOTAL HIP ARTHROPLASTY ANTERIOR APPROACH;                Surgeon: Hessie Knows, MD;  Location: [facility] ORS;                Service: Orthopedics;  Laterality: Left;     Reproductive/Obstetrics negative OB ROS                           Anesthesia Physical Anesthesia Plan  ASA: 2  Anesthesia Plan: General   Post-op Pain Management:    Induction: Intravenous  PONV Risk Score and Plan: Propofol infusion and  TIVA  Airway Management Planned: Natural Airway  Additional Equipment:   Intra-op Plan:   Post-operative Plan:   Informed Consent: I have reviewed the patients History and Physical, chart, labs and discussed the procedure including the risks, benefits and alternatives for the proposed anesthesia with the patient or authorized representative who has indicated his/her understanding and acceptance.     Dental Advisory Given  Plan Discussed with: Anesthesiologist, CRNA and Surgeon  Anesthesia Plan Comments:        Anesthesia Quick Evaluation

## 2021-09-13 NOTE — H&P (Signed)
Krista Bellows, MD [address], [place], [place], [place], [address] [address], [place], [place], [place], [address] Phone: [phone]  Fax: [phone]  Primary Care Physician:  Krista Billings, NP   Pre-Procedure History & Physical: HPI:  Krista Phelps is a 66 y.o. female is here for an colonoscopy.   Past Medical History:  Diagnosis Date   Depression    GERD (gastroesophageal reflux disease)    Meningioma (HCC)    Night sweats    Osteoarthritis    Reflux    Sinus problem     Past Surgical History:  Procedure Laterality Date   APPENDECTOMY     CHOLECYSTECTOMY     plantar wart removed     TONSILLECTOMY AND ADENOIDECTOMY     TOTAL HIP ARTHROPLASTY Left 04/02/2018   Procedure: TOTAL HIP ARTHROPLASTY ANTERIOR APPROACH;  Surgeon: Krista Knows, MD;  Location: [facility] ORS;  Service: Orthopedics;  Laterality: Left;    Prior to Admission medications   Medication Sig Start Date End Date Taking? Authorizing Provider  amLODipine (NORVASC) 10 MG tablet Take 1 tablet (10 mg total) by mouth daily. 07/29/21  Yes Krista Billings, NP  pantoprazole (PROTONIX) 40 MG tablet Take 1 tablet by mouth twice daily as needed 07/29/21  Yes Krista Billings, NP  sertraline (ZOLOFT) 100 MG tablet Take 2 tablets (200 mg total) by mouth daily. 07/29/21  Yes Krista Billings, NP  aspirin 81 MG tablet Take 81 mg by mouth daily.    [provider]  Budeson-Glycopyrrol-Formoterol (BREZTRI AEROSPHERE) 160-9-4.8 MCG/ACT AERO Inhale 2 puffs into the lungs in the morning and at bedtime. Patient not taking: Reported on 07/29/2021 09/13/20   Flora Lipps, MD  buPROPion (WELLBUTRIN XL) 300 MG 24 hr tablet Take 1 tablet (300 mg total) by mouth daily. 07/29/21   Krista Billings, NP  calcium carbonate (TUMS - DOSED IN MG ELEMENTAL CALCIUM) 500 MG chewable tablet Chew 1 tablet by mouth daily as needed for indigestion or heartburn.     [provider]  estradiol (ESTRACE VAGINAL) 0.1 MG/GM  vaginal cream Place 1 Applicatorful vaginally at bedtime as needed. 01/14/20   Johnson, Megan P, DO  Homeopathic Products (LEG CRAMPS PO) Take 4 tablets by mouth daily.    [provider]  Magnesium 250 MG TABS Take 1,000 mg by mouth daily as needed (for cramps).    [provider]  Melatonin 10 MG TABS Take 10 mg by mouth at bedtime.     [provider]  meloxicam (MOBIC) 15 MG tablet Take 1 tablet (15 mg total) by mouth daily. 07/29/21   Krista Billings, NP  Multiple Vitamins-Minerals (PRESERVISION AREDS 2 PO) Take 2 tablets by mouth daily.    [provider]  rosuvastatin (CRESTOR) 10 MG tablet Take 1 tablet (10 mg total) by mouth daily. 07/29/21   Krista Billings, NP    Allergies as of 08/24/2021 - Review Complete 08/24/2021  Allergen Reaction Noted   Atropine Other (See Comments) 09/04/2013    Family History  Problem Relation Age of Onset   Cancer Father        lung    Social History   Socioeconomic History   Marital status: Married    Spouse name: Not on file   Number of children: Not on file   Years of education: Not on file   Highest education level: Not on file  Occupational History   Not on file  Tobacco Use   Smoking status: Former  Packs/day: 3.00    Years: 11.00    Total pack years: 33.00    Types: Cigarettes    Quit date: 03/24/1983    Years since quitting: 38.5   Smokeless tobacco: Never  Vaping Use   Vaping Use: Never used  Substance and Sexual Activity   Alcohol use: No   Drug use: No   Sexual activity: Not Currently  Other Topics Concern   Not on file  Social History Narrative   Not on file   Social Determinants of Health   Financial Resource Strain: Not on file  Food Insecurity: Not on file  Transportation Needs: Not on file  Physical Activity: Not on file  Stress: Not on file  Social Connections: Not on file  Intimate Partner Violence: Not on file    Review of Systems: See HPI, otherwise negative  ROS  Physical Exam: BP (!) 123/92   Pulse 85   Temp 97.9 F (36.6 C) (Temporal)   Resp 20   Ht '5\' 2"'$  (1.575 m)   Wt 71.2 kg   SpO2 97%   BMI 28.72 kg/m  General:   Alert,  pleasant and cooperative in NAD Head:  Normocephalic and atraumatic. Neck:  Supple; no masses or thyromegaly. Lungs:  Clear throughout to auscultation, normal respiratory effort.    Heart:  +S1, +S2, Regular rate and rhythm, No edema. Abdomen:  Soft, nontender and nondistended. Normal bowel sounds, without guarding, and without rebound.   Neurologic:  Alert and  oriented x4;  grossly normal neurologically.  Impression/Plan: Krista Phelps is here for an colonoscopy to be performed for Screening colonoscopy average risk   Risks, benefits, limitations, and alternatives regarding  colonoscopy have been reviewed with the patient.  Questions have been answered.  All parties agreeable.   Krista Bellows, MD  09/13/2021, 8:01 AM

## 2021-09-13 NOTE — Op Note (Signed)
[facility] Patient Name: Krista Phelps Procedure Date: 09/13/2021 8:06 AM MRN: [id number] Account #: [id number] Date of Birth: 1955/08/27 Admit Type: Outpatient Age: 66 Room: [facility] ENDO ROOM 2 Gender: Female Note Status: Finalized Instrument Name: Jasper Riling [id number] Procedure:             Colonoscopy Indications:           Screening for colorectal malignant neoplasm Providers:             Jonathon Bellows MD, MD Medicines:             Monitored Anesthesia Care Complications:         No immediate complications. Procedure:             Pre-Anesthesia Assessment:                        - Prior to the procedure, a History and Physical was                         performed, and patient medications, allergies and                         sensitivities were reviewed. The patient's tolerance                         of previous anesthesia was reviewed.                        - The risks and benefits of the procedure and the                         sedation options and risks were discussed with the                         patient. All questions were answered and informed                         consent was obtained.                        - ASA Grade Assessment: II - A patient with mild                         systemic disease.                        After obtaining informed consent, the colonoscope was                         passed under direct vision. Throughout the procedure,                         the patient's blood pressure, pulse, and oxygen                         saturations were monitored continuously. The                         Colonoscope was introduced through the anus and  advanced to the the cecum, identified by the                         appendiceal orifice. The colonoscopy was performed                         with ease. The patient tolerated the procedure well.                         The quality of the bowel preparation  was excellent. Findings:      The perianal and digital rectal examinations were normal.      Two sessile polyps were found in the cecum. The polyps were 5 to 7 mm in       size. These polyps were removed with a cold snare. Resection and       retrieval were complete.      A 5 mm polyp was found in the descending colon. The polyp was sessile.       The polyp was removed with a cold snare. Resection and retrieval were       complete.      Multiple small and large-mouthed diverticula were found in the left       colon.      The exam was otherwise without abnormality on direct and retroflexion       views. Impression:            - Two 5 to 7 mm polyps in the cecum, removed with a                         cold snare. Resected and retrieved.                        - One 5 mm polyp in the descending colon, removed with                         a cold snare. Resected and retrieved.                        - Diverticulosis in the left colon.                        - The examination was otherwise normal on direct and                         retroflexion views. Recommendation:        - Discharge patient to home.                        - Resume previous diet.                        - Continue present medications.                        - Repeat colonoscopy in 3 - 5 years for surveillance. Procedure Code(s):     --- Professional ---                        [phone], Colonoscopy, flexible; with removal of  tumor(s), polyp(s), or other lesion(s) by snare                         technique Diagnosis Code(s):     --- Professional ---                        K63.5, Polyp of colon                        Z12.11, Encounter for screening for malignant neoplasm                         of colon                        K57.30, Diverticulosis of large intestine without                         perforation or abscess without bleeding CPT copyright 2019 American Medical Association. All rights  reserved. The codes documented in this report are preliminary and upon coder review may  be revised to meet current compliance requirements. Jonathon Bellows, MD Jonathon Bellows MD, MD 09/13/2021 8:27:33 AM This report has been signed electronically. Number of Addenda: 0 Note Initiated On: 09/13/2021 8:06 AM Scope Withdrawal Time: 0 hours 10 minutes 41 seconds  Total Procedure Duration: 0 hours 14 minutes 25 seconds  Estimated Blood Loss:  Estimated blood loss: none.      [facility]

## 2021-09-14 ENCOUNTER — Encounter: Payer: Self-pay | Admitting: Gastroenterology

## 2021-09-14 LAB — SURGICAL PATHOLOGY

## 2021-09-16 ENCOUNTER — Encounter: Payer: Self-pay | Admitting: Gastroenterology

## 2021-09-19 LAB — HM MAMMOGRAPHY

## 2021-10-02 ENCOUNTER — Other Ambulatory Visit: Payer: Self-pay | Admitting: Internal Medicine

## 2021-11-30 NOTE — Progress Notes (Unsigned)
There were no vitals taken for this visit.   Subjective:    Patient ID: Krista Phelps, female    DOB: 11-Jul-1955, 66 y.o.   MRN: [id number]  HPI: Krista Phelps is a 66 y.o. female  No chief complaint on file.  HYPERTENSION Patient has been out of medication for about a week.  Hypertension status: controlled  Satisfied with current treatment? no Duration of hypertension: years BP monitoring frequency:  daily BP range: 125/65 BP medication side effects:  no Medication compliance: excellent compliance Previous BP meds:amlodipine Aspirin: no Recurrent headaches: no Visual changes: no Palpitations: no Dyspnea: no Chest pain: no Lower extremity edema: no Dizzy/lightheaded: no    Relevant past medical, surgical, family and social history reviewed and updated as indicated. Interim medical history since our last visit reviewed. Allergies and medications reviewed and updated.  Review of Systems  Eyes:  Negative for visual disturbance.  Respiratory:  Negative for cough, chest tightness and shortness of breath.   Cardiovascular:  Negative for chest pain, palpitations and leg swelling.  Neurological:  Negative for dizziness and headaches.  Psychiatric/Behavioral:  Positive for dysphoric mood. Negative for suicidal ideas. The patient is not nervous/anxious.     Per HPI unless specifically indicated above     Objective:    There were no vitals taken for this visit.  Wt Readings from Last 3 Encounters:  09/13/21 157 lb (71.2 kg)  08/31/21 156 lb 12.8 oz (71.1 kg)  07/29/21 152 lb 9.6 oz (69.2 kg)    Physical Exam Vitals and nursing note reviewed.  Constitutional:      General: She is not in acute distress.    Appearance: Normal appearance. She is normal weight. She is not ill-appearing, toxic-appearing or diaphoretic.  HENT:     Head: Normocephalic.     Right Ear: External ear normal.     Left Ear: External ear normal.     Nose: Nose normal.     Mouth/Throat:      Mouth: Mucous membranes are moist.     Pharynx: Oropharynx is clear.  Eyes:     General:        Right eye: No discharge.        Left eye: No discharge.     Extraocular Movements: Extraocular movements intact.     Conjunctiva/sclera: Conjunctivae normal.     Pupils: Pupils are equal, round, and reactive to light.  Cardiovascular:     Rate and Rhythm: Normal rate and regular rhythm.     Heart sounds: No murmur heard. Pulmonary:     Effort: Pulmonary effort is normal. No respiratory distress.     Breath sounds: Normal breath sounds. No wheezing or rales.  Musculoskeletal:     Cervical back: Normal range of motion and neck supple.  Skin:    General: Skin is warm and dry.     Capillary Refill: Capillary refill takes less than 2 seconds.  Neurological:     General: No focal deficit present.     Mental Status: She is alert and oriented to person, place, and time. Mental status is at baseline.  Psychiatric:        Mood and Affect: Mood normal.        Behavior: Behavior normal.        Thought Content: Thought content normal.        Judgment: Judgment normal.    Results for orders placed or performed in visit on 09/20/21  HM MAMMOGRAPHY  Result Value  Ref Range   HM Mammogram 0-4 Bi-Rad 0-4 Bi-Rad, Self Reported Normal      Assessment & Plan:   Problem List Items Addressed This Visit   None    Follow up plan: No follow-ups on file.

## 2021-12-01 ENCOUNTER — Encounter: Payer: Self-pay | Admitting: Nurse Practitioner

## 2021-12-01 ENCOUNTER — Ambulatory Visit: Payer: Commercial Managed Care - PPO | Admitting: Nurse Practitioner

## 2021-12-01 VITALS — BP 126/83 | HR 71 | Temp 98.5°F | Wt 151.0 lb

## 2021-12-01 DIAGNOSIS — F339 Major depressive disorder, recurrent, unspecified: Secondary | ICD-10-CM

## 2021-12-01 DIAGNOSIS — I10 Essential (primary) hypertension: Secondary | ICD-10-CM

## 2021-12-01 DIAGNOSIS — E782 Mixed hyperlipidemia: Secondary | ICD-10-CM

## 2021-12-01 NOTE — Assessment & Plan Note (Signed)
Chronic.  Well controlled.  Will increase Zoloft to '200mg'$  daily.  Continue with Wellbutrin '300mg'$  daily.  Labs ordered today.  Return to clinic in 6 months for reevaluation.  Call sooner if concerns arise.

## 2021-12-01 NOTE — Assessment & Plan Note (Signed)
Chronic.  Controlled.  Continue with current medication regimen of Amlodipine '10mg'$ .  Labs ordered today.  Return to clinic in 3 months for reevaluation.  Call sooner if concerns arise.

## 2021-12-01 NOTE — Assessment & Plan Note (Signed)
Chronic.  Controlled.  Continue with Crestor '10mg'$   Labs ordered today.  Return to clinic in 6 months for reevaluation.  Call sooner if concerns arise.

## 2021-12-02 LAB — LIPID PANEL
Chol/HDL Ratio: 2.1 ratio (ref 0.0–4.4)
Cholesterol, Total: 202 mg/dL — ABNORMAL HIGH (ref 100–199)
HDL: 96 mg/dL (ref >39)
LDL Chol Calc (NIH): 96 mg/dL (ref 0–99)
Triglycerides: 52 mg/dL (ref 0–149)
VLDL Cholesterol Cal: 10 mg/dL (ref 5–40)

## 2021-12-02 LAB — COMPREHENSIVE METABOLIC PANEL
ALT: 10 IU/L (ref 0–32)
AST: 19 IU/L (ref 0–40)
Albumin/Globulin Ratio: 2.8 — ABNORMAL HIGH (ref 1.2–2.2)
Albumin: 4.8 g/dL (ref 3.9–4.9)
Alkaline Phosphatase: 70 IU/L (ref 44–121)
BUN/Creatinine Ratio: 21 (ref 12–28)
BUN: 18 mg/dL (ref 8–27)
Bilirubin Total: 0.3 mg/dL (ref 0.0–1.2)
CO2: 22 mmol/L (ref 20–29)
Calcium: 9.9 mg/dL (ref 8.7–10.3)
Chloride: 107 mmol/L — ABNORMAL HIGH (ref 96–106)
Creatinine, Ser: 0.86 mg/dL (ref 0.57–1.00)
Globulin, Total: 1.7 g/dL (ref 1.5–4.5)
Glucose: 80 mg/dL (ref 70–99)
Potassium: 4.5 mmol/L (ref 3.5–5.2)
Sodium: 146 mmol/L — ABNORMAL HIGH (ref 134–144)
Total Protein: 6.5 g/dL (ref 6.0–8.5)
eGFR: 74 mL/min/{1.73_m2} (ref >59)

## 2021-12-02 NOTE — Progress Notes (Signed)
Hi Estella.  Your lab work looks great.  Continue with current medication regimen.  Follow up as discussed.

## 2022-01-24 ENCOUNTER — Other Ambulatory Visit: Payer: Self-pay | Admitting: Neurology

## 2022-01-24 DIAGNOSIS — D329 Benign neoplasm of meninges, unspecified: Secondary | ICD-10-CM

## 2022-02-11 ENCOUNTER — Other Ambulatory Visit: Payer: Self-pay | Admitting: Nurse Practitioner

## 2022-02-13 NOTE — Telephone Encounter (Signed)
Requested Prescriptions  Pending Prescriptions Disp Refills   rosuvastatin (CRESTOR) 10 MG tablet [Pharmacy Med Name: Rosuvastatin Calcium 10 MG Oral Tablet] 90 tablet 3    Sig: Take 1 tablet by mouth once daily     Cardiovascular:  Antilipid - Statins 2 Failed - 02/11/2022  1:36 PM      Failed - Lipid Panel in normal range within the last 12 months    Cholesterol, Total  Date Value Ref Range Status  12/01/2021 202 (H) 100 - 199 mg/dL Final   LDL Chol Calc (NIH)  Date Value Ref Range Status  12/01/2021 96 0 - 99 mg/dL Final   HDL  Date Value Ref Range Status  12/01/2021 96 >39 mg/dL Final   Triglycerides  Date Value Ref Range Status  12/01/2021 52 0 - 149 mg/dL Final         Passed - Cr in normal range and within 360 days    Creatinine, Ser  Date Value Ref Range Status  12/01/2021 0.86 0.57 - 1.00 mg/dL Final         Passed - Patient is not pregnant      Passed - Valid encounter within last 12 months    Recent Outpatient Visits           2 months ago Depression, recurrent ([place])   [place], Karen, NP   5 months ago Primary hypertension   [facility] Jon Billings, NP   6 months ago Annual physical exam   [facility] Jon Billings, NP   1 year ago Primary hypertension   [facility] Jon Billings, NP   1 year ago Primary hypertension   [facility] Jon Billings, NP       Future Appointments             In 3 months Jon Billings, NP [facility], [facility]             amLODipine ([place]) 10 MG tablet [Pharmacy Med Name: amLODIPine Besylate 10 MG Oral Tablet] 90 tablet 0    Sig: Take 1 tablet by mouth once daily     Cardiovascular: Calcium Channel Blockers 2 Passed - 02/11/2022  1:36 PM      Passed - Last BP in normal range    BP Readings from Last 1 Encounters:  12/01/21 126/83         Passed - Last Heart Rate in normal range    Pulse Readings  from Last 1 Encounters:  12/01/21 71         Passed - Valid encounter within last 6 months    Recent Outpatient Visits           2 months ago Depression, recurrent ([place])   [place], NP   5 months ago Primary hypertension   [facility] Jon Billings, NP   6 months ago Annual physical exam   [facility] Jon Billings, NP   1 year ago Primary hypertension   [facility] Jon Billings, NP   1 year ago Primary hypertension   [facility] Jon Billings, NP       Future Appointments             In 3 months Jon Billings, NP [facility], [place]

## 2022-02-23 ENCOUNTER — Ambulatory Visit
Admission: RE | Admit: 2022-02-23 | Discharge: 2022-02-23 | Disposition: A | Discharge status: Home / Self Care | Payer: Commercial Managed Care - PPO | Source: Ambulatory Visit | Attending: Neurology | Admitting: Neurology

## 2022-02-23 DIAGNOSIS — D329 Benign neoplasm of meninges, unspecified: Secondary | ICD-10-CM

## 2022-02-23 MED ORDER — GADOBENATE DIMEGLUMINE 529 MG/ML IV SOLN
14.0000 mL | Freq: Once | INTRAVENOUS | Status: AC | PRN
  Administered 2022-02-23: 14 mL via INTRAVENOUS

## 2022-03-15 ENCOUNTER — Other Ambulatory Visit: Payer: Self-pay | Admitting: Nurse Practitioner

## 2022-03-15 NOTE — Telephone Encounter (Signed)
Requested Prescriptions  Pending Prescriptions Disp Refills   sertraline (ZOLOFT) 100 MG tablet [Pharmacy Med Name: Sertraline HCl 100 MG Oral Tablet] 180 tablet 0    Sig: Take 2 tablets by mouth once daily     Psychiatry:  Antidepressants - SSRI - sertraline Passed - 03/15/2022  3:05 PM      Passed - AST in normal range and within 360 days    AST  Date Value Ref Range Status  12/01/2021 19 0 - 40 IU/L Final         Passed - ALT in normal range and within 360 days    ALT  Date Value Ref Range Status  12/01/2021 10 0 - 32 IU/L Final         Passed - Completed PHQ-2 or PHQ-9 in the last 360 days      Passed - Valid encounter within last 6 months    Recent Outpatient Visits           3 months ago Depression, recurrent ([place])   [place], Karen, NP   6 months ago Primary hypertension   [facility] Jon Billings, NP   7 months ago Annual physical exam   [facility] Jon Billings, NP   1 year ago Primary hypertension   [facility] Jon Billings, NP   1 year ago Primary hypertension   [facility] Jon Billings, NP       Future Appointments             In 2 months Jon Billings, NP [place], [facility]             buPROPion (WELLBUTRIN XL) 300 MG 24 hr tablet [Pharmacy Med Name: buPROPion HCl ER (XL) 300 MG Oral Tablet Extended Release 24 Hour] 90 tablet 0    Sig: Take 1 tablet by mouth once daily     Psychiatry: Antidepressants - bupropion Passed - 03/15/2022  3:05 PM      Passed - Cr in normal range and within 360 days    Creatinine, Ser  Date Value Ref Range Status  12/01/2021 0.86 0.57 - 1.00 mg/dL Final         Passed - AST in normal range and within 360 days    AST  Date Value Ref Range Status  12/01/2021 19 0 - 40 IU/L Final         Passed - ALT in normal range and within 360 days    ALT  Date Value Ref Range Status  12/01/2021 10 0 - 32 IU/L Final          Passed - Completed PHQ-2 or PHQ-9 in the last 360 days      Passed - Last BP in normal range    BP Readings from Last 1 Encounters:  12/01/21 126/83         Passed - Valid encounter within last 6 months    Recent Outpatient Visits           3 months ago Depression, recurrent ([place])   [place], NP   6 months ago Primary hypertension   [facility] Jon Billings, NP   7 months ago Annual physical exam   [facility] Jon Billings, NP   1 year ago Primary hypertension   [facility] Jon Billings, NP   1 year ago Primary hypertension   [facility] Jon Billings, NP  Future Appointments             In 2 months Jon Billings, NP [facility], [place]

## 2022-05-18 ENCOUNTER — Other Ambulatory Visit: Payer: Self-pay | Admitting: Nurse Practitioner

## 2022-05-18 NOTE — Telephone Encounter (Signed)
Requested Prescriptions  Pending Prescriptions Disp Refills   meloxicam (MOBIC) 15 MG tablet [Pharmacy Med Name: Meloxicam 15 MG Oral Tablet] 90 tablet 0    Sig: Take 1 tablet by mouth once daily     Analgesics:  COX2 Inhibitors Failed - 05/18/2022 11:03 AM      Failed - Manual Review: Labs are only required if the patient has taken medication for more than 8 weeks.      Passed - HGB in normal range and within 360 days    Hemoglobin  Date Value Ref Range Status  07/29/2021 14.9 11.1 - 15.9 g/dL Final         Passed - Cr in normal range and within 360 days    Creatinine, Ser  Date Value Ref Range Status  12/01/2021 0.86 0.57 - 1.00 mg/dL Final         Passed - HCT in normal range and within 360 days    Hematocrit  Date Value Ref Range Status  07/29/2021 46.3 34.0 - 46.6 % Final         Passed - AST in normal range and within 360 days    AST  Date Value Ref Range Status  12/01/2021 19 0 - 40 IU/L Final         Passed - ALT in normal range and within 360 days    ALT  Date Value Ref Range Status  12/01/2021 10 0 - 32 IU/L Final         Passed - eGFR is 30 or above and within 360 days    GFR calc Af Amer  Date Value Ref Range Status  03/17/2019 78 >59 mL/min/1.73 Final   GFR calc non Af Amer  Date Value Ref Range Status  03/17/2019 68 >59 mL/min/1.73 Final   eGFR  Date Value Ref Range Status  12/01/2021 74 >59 mL/min/1.73 Final         Passed - Patient is not pregnant      Passed - Valid encounter within last 12 months    Recent Outpatient Visits           5 months ago Depression, recurrent ([place])   [place] Jon Billings, NP   8 months ago Primary hypertension   [place] Jon Billings, NP   9 months ago Annual physical exam   [place] Jon Billings, NP   1 year ago Primary hypertension   [place] Jon Billings, NP   1 year ago  Primary hypertension   [place], Karen, NP       Future Appointments             In 2 weeks Jon Billings, NP [place], [facility]             amLODipine (NORVASC) 10 MG tablet [Pharmacy Med Name: amLODIPine Besylate 10 MG Oral Tablet] 90 tablet 0    Sig: Take 1 tablet by mouth once daily     Cardiovascular: Calcium Channel Blockers 2 Passed - 05/18/2022 11:03 AM      Passed - Last BP in normal range    BP Readings from Last 1 Encounters:  12/01/21 126/83         Passed - Last Heart Rate in normal range    Pulse Readings from Last 1 Encounters:  12/01/21 71         Passed -  Valid encounter within last 6 months    Recent Outpatient Visits           5 months ago Depression, recurrent [facility])   [place], NP   8 months ago Primary hypertension   [place] Jon Billings, NP   9 months ago Annual physical exam   [place] Jon Billings, NP   1 year ago Primary hypertension   [place] Jon Billings, NP   1 year ago Primary hypertension   [place] Jon Billings, NP       Future Appointments             In 2 weeks Jon Billings, NP [place], [facility]

## 2022-06-01 NOTE — Progress Notes (Signed)
BP 131/79 (BP Location: Right Arm, Cuff Size: Normal)   Pulse 73   Temp 97.9 F (36.6 C) (Oral)   Wt 157 lb 1.6 oz (71.3 kg)   SpO2 97%   BMI 28.73 kg/m    Subjective:    Patient ID: Krista Phelps, female    DOB: [birth year], 67 y.o.   MRN: [id number]  HPI: Krista Phelps is a 67 y.o. female  Chief Complaint  Patient presents with   Depression   Hyperlipidemia   Hypertension   HYPERTENSION Hypertension status: controlled  Satisfied with current treatment? no Duration of hypertension: years BP monitoring frequency:  daily BP range: 125/65 BP medication side effects:  no Medication compliance: excellent compliance Previous BP meds:amlodipine Aspirin: no Recurrent headaches: no Visual changes: no Palpitations: no Dyspnea: no Chest pain: no Lower extremity edema: no Dizzy/lightheaded: no  MOOD Patient states she is doing well.  States it is about the same.  Still taking Zoloft 200mg  and feels like it is working for her.    MUCOPURULENT BRONCHITIS Doing well with Breztri.  Using it every once in awhile.  Followed by Pulmonology. Needs an updated appointment.   Relevant past medical, surgical, family and social history reviewed and updated as indicated. Interim medical history since our last visit reviewed. Allergies and medications reviewed and updated.  Review of Systems  Eyes:  Negative for visual disturbance.  Respiratory:  Negative for cough, chest tightness and shortness of breath.   Cardiovascular:  Negative for chest pain, palpitations and leg swelling.  Neurological:  Negative for dizziness and headaches.  Psychiatric/Behavioral:  Positive for dysphoric mood. Negative for suicidal ideas. The patient is not nervous/anxious.     Per HPI unless specifically indicated above     Objective:    BP 131/79 (BP Location: Right Arm, Cuff Size: Normal)   Pulse 73   Temp 97.9 F (36.6 C) (Oral)   Wt 157 lb 1.6 oz (71.3 kg)   SpO2 97%   BMI 28.73 kg/m   Wt  Readings from Last 3 Encounters:  06/02/22 157 lb 1.6 oz (71.3 kg)  12/01/21 151 lb (68.5 kg)  09/13/21 157 lb (71.2 kg)    Physical Exam Vitals and nursing note reviewed.  Constitutional:      General: She is not in acute distress.    Appearance: Normal appearance. She is normal weight. She is not ill-appearing, toxic-appearing or diaphoretic.  HENT:     Head: Normocephalic.     Right Ear: External ear normal.     Left Ear: External ear normal.     Nose: Nose normal.     Mouth/Throat:     Mouth: Mucous membranes are moist.     Pharynx: Oropharynx is clear.  Eyes:     General:        Right eye: No discharge.        Left eye: No discharge.     Extraocular Movements: Extraocular movements intact.     Conjunctiva/sclera: Conjunctivae normal.     Pupils: Pupils are equal, round, and reactive to light.  Cardiovascular:     Rate and Rhythm: Normal rate and regular rhythm.     Heart sounds: No murmur heard. Pulmonary:     Effort: Pulmonary effort is normal. No respiratory distress.     Breath sounds: Normal breath sounds. No wheezing or rales.  Musculoskeletal:     Cervical back: Normal range of motion and neck supple.  Skin:    General: Skin is  warm and dry.     Capillary Refill: Capillary refill takes less than 2 seconds.  Neurological:     General: No focal deficit present.     Mental Status: She is alert and oriented to person, place, and time. Mental status is at baseline.  Psychiatric:        Mood and Affect: Mood normal.        Behavior: Behavior normal.        Thought Content: Thought content normal.        Judgment: Judgment normal.     Results for orders placed or performed in visit on 12/01/21  Comp Met (CMET)  Result Value Ref Range   Glucose 80 70 - 99 mg/dL   BUN 18 8 - 27 mg/dL   Creatinine, Ser 7.61 0.57 - 1.00 mg/dL   eGFR 74 >95 KD/TOI/7.12   BUN/Creatinine Ratio 21 12 - 28   Sodium 146 (H) 134 - 144 mmol/L   Potassium 4.5 3.5 - 5.2 mmol/L    Chloride 107 (H) 96 - 106 mmol/L   CO2 22 20 - 29 mmol/L   Calcium 9.9 8.7 - 10.3 mg/dL   Total Protein 6.5 6.0 - 8.5 g/dL   Albumin 4.8 3.9 - 4.9 g/dL   Globulin, Total 1.7 1.5 - 4.5 g/dL   Albumin/Globulin Ratio 2.8 (H) 1.2 - 2.2   Bilirubin Total 0.3 0.0 - 1.2 mg/dL   Alkaline Phosphatase 70 44 - 121 IU/L   AST 19 0 - 40 IU/L   ALT 10 0 - 32 IU/L  Lipid Profile  Result Value Ref Range   Cholesterol, Total 202 (H) 100 - 199 mg/dL   Triglycerides 52 0 - 149 mg/dL   HDL 96 >45 mg/dL   VLDL Cholesterol Cal 10 5 - 40 mg/dL   LDL Chol Calc (NIH) 96 0 - 99 mg/dL   Chol/HDL Ratio 2.1 0.0 - 4.4 ratio      Assessment & Plan:   Problem List Items Addressed This Visit       Cardiovascular and Mediastinum   Hypertension    Chronic.  Controlled.  Continue with current medication regimen of Amlodipine 10mg .  Refills sent today.  Labs ordered today.  Return to clinic in 6 months for reevaluation.  Call sooner if concerns arise.        Relevant Medications   amLODipine (NORVASC) 10 MG tablet   rosuvastatin (CRESTOR) 10 MG tablet   Other Relevant Orders   Comp Met (CMET)     Respiratory   Mucopurulent chronic bronchitis    Chronic.  Educated on proper use of Breztri.  Recommend updated appointment with Pulmonology.  Continue with current medication regimen.         Other   Depression, recurrent    Chronic.  Well controlled.  Will increase Zoloft to 200mg  daily.  Continue with Wellbutrin 300mg  daily.  Labs ordered today.  Return to clinic in 6 months for reevaluation.  Call sooner if concerns arise.        Relevant Medications   buPROPion (WELLBUTRIN XL) 300 MG 24 hr tablet   sertraline (ZOLOFT) 100 MG tablet   Hyperlipidemia - Primary    Chronic.  Controlled.  Continue with Crestor 10mg .  Refills sent today.  Labs ordered today.  Return to clinic in 6 months for reevaluation.  Call sooner if concerns arise.       Relevant Medications   amLODipine (NORVASC) 10 MG tablet    rosuvastatin (CRESTOR) 10  MG tablet   Other Relevant Orders   Lipid Profile     Follow up plan: Return in about 6 months (around 12/02/2022) for Physical and Fasting labs.

## 2022-06-02 ENCOUNTER — Ambulatory Visit: Payer: Commercial Managed Care - PPO | Admitting: Nurse Practitioner

## 2022-06-02 ENCOUNTER — Encounter: Payer: Self-pay | Admitting: Nurse Practitioner

## 2022-06-02 VITALS — BP 131/79 | HR 73 | Temp 97.9°F | Wt 157.1 lb

## 2022-06-02 DIAGNOSIS — E782 Mixed hyperlipidemia: Secondary | ICD-10-CM | POA: Diagnosis not present

## 2022-06-02 DIAGNOSIS — I10 Essential (primary) hypertension: Secondary | ICD-10-CM | POA: Diagnosis not present

## 2022-06-02 DIAGNOSIS — F339 Major depressive disorder, recurrent, unspecified: Secondary | ICD-10-CM | POA: Diagnosis not present

## 2022-06-02 DIAGNOSIS — J411 Mucopurulent chronic bronchitis: Secondary | ICD-10-CM | POA: Insufficient documentation

## 2022-06-02 MED ORDER — BREZTRI AEROSPHERE 160-9-4.8 MCG/ACT IN AERO: INHALATION_SPRAY | RESPIRATORY_TRACT | 2 refills | Status: DC

## 2022-06-02 MED ORDER — ROSUVASTATIN CALCIUM 10 MG PO TABS: 10.0000 mg | ORAL_TABLET | Freq: Every day | ORAL | 1 refills | Status: DC

## 2022-06-02 MED ORDER — PANTOPRAZOLE SODIUM 40 MG PO TBEC: DELAYED_RELEASE_TABLET | ORAL | 1 refills | Status: DC

## 2022-06-02 MED ORDER — BUPROPION HCL ER (XL) 300 MG PO TB24: 300.0000 mg | ORAL_TABLET | Freq: Every day | ORAL | 1 refills | Status: DC

## 2022-06-02 MED ORDER — SERTRALINE HCL 100 MG PO TABS: 200.0000 mg | ORAL_TABLET | Freq: Every day | ORAL | 1 refills | Status: DC

## 2022-06-02 MED ORDER — AMLODIPINE BESYLATE 10 MG PO TABS: 10.0000 mg | ORAL_TABLET | Freq: Every day | ORAL | 1 refills | Status: DC

## 2022-06-02 NOTE — Assessment & Plan Note (Signed)
Chronic.  Well controlled.  Will increase Zoloft to 200mg daily.  Continue with Wellbutrin 300mg daily.  Labs ordered today.  Return to clinic in 6 months for reevaluation.  Call sooner if concerns arise.   

## 2022-06-02 NOTE — Assessment & Plan Note (Signed)
Chronic.  Controlled.  Continue with current medication regimen of Amlodipine 10mg .  Refills sent today.  Labs ordered today.  Return to clinic in 6 months for reevaluation.  Call sooner if concerns arise.

## 2022-06-02 NOTE — Assessment & Plan Note (Signed)
Chronic.  Controlled.  Continue with Crestor 10mg .  Refills sent today.  Labs ordered today.  Return to clinic in 6 months for reevaluation.  Call sooner if concerns arise.

## 2022-06-02 NOTE — Assessment & Plan Note (Signed)
Chronic.  Educated on proper use of Breztri.  Recommend updated appointment with Pulmonology.  Continue with current medication regimen.

## 2022-06-03 LAB — COMPREHENSIVE METABOLIC PANEL
ALT: 15 IU/L (ref 0–32)
AST: 20 IU/L (ref 0–40)
Albumin/Globulin Ratio: 2.5 — ABNORMAL HIGH (ref 1.2–2.2)
Albumin: 4.7 g/dL (ref 3.9–4.9)
Alkaline Phosphatase: 75 IU/L (ref 44–121)
BUN/Creatinine Ratio: 19 (ref 12–28)
BUN: 18 mg/dL (ref 8–27)
Bilirubin Total: 0.3 mg/dL (ref 0.0–1.2)
CO2: 23 mmol/L (ref 20–29)
Calcium: 9.7 mg/dL (ref 8.7–10.3)
Chloride: 104 mmol/L (ref 96–106)
Creatinine, Ser: 0.97 mg/dL (ref 0.57–1.00)
Globulin, Total: 1.9 g/dL (ref 1.5–4.5)
Glucose: 77 mg/dL (ref 70–99)
Potassium: 4.4 mmol/L (ref 3.5–5.2)
Sodium: 140 mmol/L (ref 134–144)
Total Protein: 6.6 g/dL (ref 6.0–8.5)
eGFR: 64 mL/min/{1.73_m2} (ref >59)

## 2022-06-03 LAB — LIPID PANEL
Chol/HDL Ratio: 2.1 ratio (ref 0.0–4.4)
Cholesterol, Total: 195 mg/dL (ref 100–199)
HDL: 94 mg/dL (ref >39)
LDL Chol Calc (NIH): 83 mg/dL (ref 0–99)
Triglycerides: 103 mg/dL (ref 0–149)
VLDL Cholesterol Cal: 18 mg/dL (ref 5–40)

## 2022-06-05 NOTE — Progress Notes (Signed)
Hi Krista Phelps. It was nice to see you last week.  Your lab work looks good.  No concerns at this time. Continue with your current medication regimen.  Follow up as discussed.  Please let me know if you have any questions.

## 2022-08-13 ENCOUNTER — Other Ambulatory Visit: Payer: Self-pay | Admitting: Nurse Practitioner

## 2022-08-14 NOTE — Telephone Encounter (Signed)
Requested medication (s) are due for refill today: Yes  Requested medication (s) are on the active medication list: Yes  Last refill:  05/18/22  Future visit scheduled: Yes  Notes to clinic:  Manual review.    Requested Prescriptions  Pending Prescriptions Disp Refills   meloxicam (MOBIC) 15 MG tablet [Pharmacy Med Name: Meloxicam 15 MG Oral Tablet] 90 tablet 0    Sig: Take 1 tablet by mouth once daily     Analgesics:  COX2 Inhibitors Failed - 08/13/2022 11:57 AM      Failed - Manual Review: Labs are only required if the patient has taken medication for more than 8 weeks.      Failed - HGB in normal range and within 360 days    Hemoglobin  Date Value Ref Range Status  07/29/2021 14.9 11.1 - 15.9 g/dL Final         Failed - HCT in normal range and within 360 days    Hematocrit  Date Value Ref Range Status  07/29/2021 46.3 34.0 - 46.6 % Final         Passed - Cr in normal range and within 360 days    Creatinine, Ser  Date Value Ref Range Status  06/02/2022 0.97 0.57 - 1.00 mg/dL Final         Passed - AST in normal range and within 360 days    AST  Date Value Ref Range Status  06/02/2022 20 0 - 40 IU/L Final         Passed - ALT in normal range and within 360 days    ALT  Date Value Ref Range Status  06/02/2022 15 0 - 32 IU/L Final         Passed - eGFR is 30 or above and within 360 days    GFR calc Af Amer  Date Value Ref Range Status  03/17/2019 78 >59 mL/min/1.73 Final   GFR calc non Af Amer  Date Value Ref Range Status  03/17/2019 68 >59 mL/min/1.73 Final   eGFR  Date Value Ref Range Status  06/02/2022 64 >59 mL/min/1.73 Final         Passed - Patient is not pregnant      Passed - Valid encounter within last 12 months    Recent Outpatient Visits           2 months ago Mixed hyperlipidemia   [place] [facility] Larae Grooms, NP   8 months ago Depression, recurrent [facility])   [place] [facility] Larae Grooms, NP   11 months ago Primary hypertension   [place] [facility] Larae Grooms, NP   1 year ago Annual physical exam   [place] [facility] Larae Grooms, NP   1 year ago Primary hypertension   [place] [facility] Larae Grooms, NP       Future Appointments             In 3 months Larae Grooms, NP [place] [facility], [facility]

## 2022-08-17 ENCOUNTER — Other Ambulatory Visit: Payer: Self-pay | Admitting: Nurse Practitioner

## 2022-08-17 MED ORDER — MELOXICAM 15 MG PO TABS: 15.0000 mg | ORAL_TABLET | Freq: Every day | ORAL | 0 refills | Status: DC

## 2022-08-17 NOTE — Telephone Encounter (Signed)
Requested Prescriptions  Pending Prescriptions Disp Refills   meloxicam (MOBIC) 15 MG tablet 90 tablet 0    Sig: Take 1 tablet (15 mg total) by mouth daily.     Analgesics:  COX2 Inhibitors Failed - 08/17/2022  3:27 PM      Failed - Manual Review: Labs are only required if the patient has taken medication for more than 8 weeks.      Failed - HGB in normal range and within 360 days    Hemoglobin  Date Value Ref Range Status  07/29/2021 14.9 11.1 - 15.9 g/dL Final         Failed - HCT in normal range and within 360 days    Hematocrit  Date Value Ref Range Status  07/29/2021 46.3 34.0 - 46.6 % Final         Passed - Cr in normal range and within 360 days    Creatinine, Ser  Date Value Ref Range Status  06/02/2022 0.97 0.57 - 1.00 mg/dL Final         Passed - AST in normal range and within 360 days    AST  Date Value Ref Range Status  06/02/2022 20 0 - 40 IU/L Final         Passed - ALT in normal range and within 360 days    ALT  Date Value Ref Range Status  06/02/2022 15 0 - 32 IU/L Final         Passed - eGFR is 30 or above and within 360 days    GFR calc Af Amer  Date Value Ref Range Status  03/17/2019 78 >59 mL/min/1.73 Final   GFR calc non Af Amer  Date Value Ref Range Status  03/17/2019 68 >59 mL/min/1.73 Final   eGFR  Date Value Ref Range Status  06/02/2022 64 >59 mL/min/1.73 Final         Passed - Patient is not pregnant      Passed - Valid encounter within last 12 months    Recent Outpatient Visits           2 months ago Mixed hyperlipidemia   [place] [facility] Larae Grooms, NP   8 months ago Depression, recurrent [facility])   [place] [facility] Larae Grooms, NP   11 months ago Primary hypertension   [place] [facility] Larae Grooms, NP   1 year ago Annual physical exam   [place] [facility], NP   1 year ago Primary hypertension   [place]  [facility] Larae Grooms, NP       Future Appointments             In 3 months Larae Grooms, NP [place] [facility], [facility]

## 2022-08-17 NOTE — Telephone Encounter (Signed)
Medication Refill - Medication: meloxicam (MOBIC) 15 MG tablet   Has the patient contacted their pharmacy? Yes.   Pt told to contact provider  Preferred Pharmacy (with phone number or street name):  [facility] [address] [place] - [address] Phone: [phone]  Fax: [phone]     Has the patient been seen for an appointment in the last year OR does the patient have an upcoming appointment? Yes.    Agent: Please be advised that RX refills may take up to 3 business days. We ask that you follow-up with your pharmacy.

## 2022-09-04 ENCOUNTER — Other Ambulatory Visit: Payer: Self-pay | Admitting: Nurse Practitioner

## 2022-09-04 NOTE — Telephone Encounter (Signed)
Requested medication (s) are due for refill today - yes  Requested medication (s) are on the active medication list -yes  Future visit scheduled -yes  Last refill: 06/02/22 10.7g 2RF  Notes to clinic: off protocol- provider review   Requested Prescriptions  Pending Prescriptions Disp Refills   BREZTRI AEROSPHERE 160-9-4.8 MCG/ACT AERO [Pharmacy Med Name: Markus Daft Aerosphere 160-9-4.8 MCG/ACT Inhalation Aerosol] 11 g 0    Sig: INHALE 2 PUFFS IN THE MORNING AND 2 PUFFS AT BEDTIME     Off-Protocol Failed - 09/04/2022  6:50 AM      Failed - Medication not assigned to a protocol, review manually.      Passed - Valid encounter within last 12 months    Recent Outpatient Visits           3 months ago Mixed hyperlipidemia   [place] [facility] Larae G[facility], NP   9 months ago Depression, recurrent [facility])   [place] [facility] Larae G[facility], NP   1 year ago Primary hypertension   [place] [facility] Larae Grooms, NP   1 year ago Annual physical exam   [place] [facility] Larae G[facility], NP   1 year ago Primary hypertension   [place] [facility] Larae Grooms, NP       Future Appointments             In 3 months Larae Grooms, NP [place] [facility], [facility]               Requested Prescriptions  Pending Prescriptions Disp Refills   BREZTRI AEROSPHERE 160-9-4.8 MCG/ACT [place]ie Ann Med Name: Markus Daft Aerosphere 160-9-4.8 MCG/ACT Inhalation Aerosol] 11 g 0    Sig: INHALE 2 PUFFS IN THE MORNING AND 2 PUFFS AT BEDTIME     Off-Protocol Failed - 09/04/2022  6:50 AM      Failed - Medication not assigned to a protocol, review manually.      Passed - Valid encounter within last 12 months    Recent Outpatient Visits           3 months ago Mixed hyperlipidemia   [place] [facility] Larae Grooms, NP   9 months ago Depression, recurrent  [facility])   [place] [facility] Larae Grooms, NP   1 year ago Primary hypertension   [place] [facility] Larae Grooms, NP   1 year ago Annual physical exam   [place] [facility] Larae G[facility], NP   1 year ago Primary hypertension   [place] [facility] Larae Grooms, NP       Future Appointments             In 3 months Larae Grooms, NP [place] [facility], [facility]

## 2022-11-04 ENCOUNTER — Other Ambulatory Visit: Payer: Self-pay | Admitting: Nurse Practitioner

## 2022-11-06 NOTE — Telephone Encounter (Signed)
Requested medications are due for refill today.  yes  Requested medications are on the active medications list.  yes  Last refill. 09/05/2022   Future visit scheduled.   yes  Notes to clinic.  Medication not assigned to a protocol. Please review for refill.    Requested Prescriptions  Pending Prescriptions Disp Refills   BREZTRI AEROSPHERE 160-9-4.8 MCG/ACT AERO [Pharmacy Med Name: BREZTRI 160-9-4.8 MCG/ACT  AER] 11 g 0    Sig: INHALE 2 PUFFS IN THE MORNING 2 AT BEDTIME     Off-Protocol Failed - 11/04/2022  9:19 AM      Failed - Medication not assigned to a protocol, review manually.      Passed - Valid encounter within last 12 months    Recent Outpatient Visits           5 months ago Mixed hyperlipidemia   [place] [facility]ae G[facility], NP   11 months ago Depression, recurrent [facility])   [place] [facility] Larae Grooms, NP   1 year ago Primary hypertension   [place] [facility] Larae G[facility], NP   1 year ago Annual physical exam   [place] [facility] Larae Grooms, NP   1 year ago Primary hypertension   [place] [facility] Larae Grooms, NP       Future Appointments             In 4 weeks Larae Grooms, NP [place] [facility], [facility]

## 2022-11-14 ENCOUNTER — Other Ambulatory Visit: Payer: Self-pay | Admitting: Nurse Practitioner

## 2022-11-15 NOTE — Telephone Encounter (Signed)
Requested medication (s) are due for refill today: Yes  Requested medication (s) are on the active medication list: Yes  Last refill:  08/17/22  Future visit scheduled: Yes  Notes to clinic:  Manual review.    Requested Prescriptions  Pending Prescriptions Disp Refills   meloxicam (MOBIC) 15 MG tablet [Pharmacy Med Name: Meloxicam 15 MG Oral Tablet] 90 tablet 0    Sig: Take 1 tablet by mouth once daily     Analgesics:  COX2 Inhibitors Failed - 11/14/2022 11:44 AM      Failed - Manual Review: Labs are only required if the patient has taken medication for more than 8 weeks.      Failed - HGB in normal range and within 360 days    Hemoglobin  Date Value Ref Range Status  07/29/2021 14.9 11.1 - 15.9 g/dL Final         Failed - HCT in normal range and within 360 days    Hematocrit  Date Value Ref Range Status  07/29/2021 46.3 34.0 - 46.6 % Final         Passed - Cr in normal range and within 360 days    Creatinine, Ser  Date Value Ref Range Status  06/02/2022 0.97 0.57 - 1.00 mg/dL Final         Passed - AST in normal range and within 360 days    AST  Date Value Ref Range Status  06/02/2022 20 0 - 40 IU/L Final         Passed - ALT in normal range and within 360 days    ALT  Date Value Ref Range Status  06/02/2022 15 0 - 32 IU/L Final         Passed - eGFR is 30 or above and within 360 days    GFR calc Af Amer  Date Value Ref Range Status  03/17/2019 78 >59 mL/min/1.73 Final   GFR calc non Af Amer  Date Value Ref Range Status  03/17/2019 68 >59 mL/min/1.73 Final   eGFR  Date Value Ref Range Status  06/02/2022 64 >59 mL/min/1.73 Final         Passed - Patient is not pregnant      Passed - Valid encounter within last 12 months    Recent Outpatient Visits           5 months ago Mixed hyperlipidemia   [place] [facility] Larae Grooms, NP   11 months ago Depression, recurrent [facility])   [place] [facility] Larae Grooms, NP   1 year ago Primary hypertension   [place] [facility] Larae Grooms, NP   1 year ago Annual physical exam   [place] [facility] Larae Grooms, NP   1 year ago Primary hypertension   [place] [facility] Larae Grooms, NP       Future Appointments             In 2 weeks Larae Grooms, NP  [place] [facility], [facility]

## 2022-12-04 NOTE — Progress Notes (Unsigned)
There were no vitals taken for this visit.   Subjective:    Patient ID: Krista Phelps, female    DOB: February 13, 1956, 67 y.o.   MRN: [id number]  HPI: Krista Phelps is a 67 y.o. female presenting on 12/05/2022 for comprehensive medical examination. Current medical complaints include:none  She currently lives with: Menopausal Symptoms: no  HYPERTENSION / HYPERLIPIDEMIA Satisfied with current treatment? yes Duration of hypertension: years BP monitoring frequency: weekly BP range: 130-140/80 BP medication side effects: no Past BP meds: amlodipine Duration of hyperlipidemia: years Cholesterol medication side effects: no Cholesterol supplements: none Past cholesterol medications: rosuvastatin (crestor) Medication compliance: excellent compliance Aspirin: no Recent stressors: no Recurrent headaches: no Visual changes: no Palpitations: no Dyspnea: no Chest pain: no Lower extremity edema: no Dizzy/lightheaded: no  MOOD Patient states she feels like it kind of sucks.  She feels like there is a reasoning behind it.  She has lost 3 of her animals.  She is taking both the Wellbutrin and the Zoloft.  She feels like she hasn't been doing well.    Depression Screen done today and results listed below:     06/02/2022    9:51 AM 12/01/2021   10:03 AM 08/31/2021   10:32 AM 07/29/2021   10:11 AM 01/26/2021   11:02 AM  Depression screen PHQ 2/9  Decreased Interest 3 2 2 3 3   Down, Depressed, Hopeless 1 2 3 3 1   PHQ - 2 Score 4 4 5 6 4   Altered sleeping 3 3 1 3 3   Tired, decreased energy 2 2 2 3 3   Change in appetite 3 3 3 3 3   Feeling bad or failure about yourself  0 0 0 1 0  Trouble concentrating 0 0 0 0 0  Moving slowly or fidgety/restless 0 1 1 0 0  Suicidal thoughts 0 0 0 0 0  PHQ-9 Score 12 13 12 16 13   Difficult doing work/chores Very difficult Very difficult Somewhat difficult Very difficult Somewhat difficult    The patient does not have a history of falls. I did complete a  risk assessment for falls. A plan of care for falls was documented.   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   aspirin 81 MG tablet Take 81 mg by mouth daily.   Budeson-Glycopyrrol-Formoterol (BREZTRI AEROSPHERE) 160-9-4.8 MCG/ACT AERO INHALE 2 PUFFS IN THE MORNING 2 AT BEDTIME   buPROPion (WELLBUTRIN XL) 300 MG 24 hr tablet Take 1 tablet (300 mg total) by mouth daily.   calcium carbonate (TUMS - DOSED  IN MG ELEMENTAL CALCIUM) 500 MG chewable tablet Chew 1 tablet by mouth daily as needed for indigestion or heartburn.    Homeopathic Products (LEG CRAMPS PO) Take 4 tablets by mouth daily.   Magnesium 250 MG TABS Take 1,000 mg by mouth daily as needed (for cramps).   Melatonin 10 MG TABS Take 20 mg by mouth at bedtime.   meloxicam (MOBIC) 15 MG tablet Take 1 tablet by mouth once daily   Multiple Vitamins-Minerals (PRESERVISION AREDS 2 PO) Take 2 tablets by mouth daily.   pantoprazole (PROTONIX) 40 MG tablet Take 1 tablet by mouth twice daily as needed   rosuvastatin (CRESTOR) 10 MG tablet Take 1 tablet (10 mg total) by mouth  daily.   sertraline (ZOLOFT) 100 MG tablet Take 2 tablets (200 mg total) by mouth daily.   No current facility-administered medications on file prior to visit.    Allergies:  Allergies  Allergen Reactions   Atropine Other (See Comments)    Makes pt hyperactive    Social History:  Social History   Socioeconomic History   Marital status: Married    Spouse name: Not on file   Number of children: Not on file   Years of education: Not on file   Highest education level: Not on file  Occupational History   Not on file  Tobacco Use   Smoking status: Former    Current packs/day: 0.00    Average packs/day: 3.0 packs/day for 11.0 years (33.0 ttl pk-yrs)    Types: Cigarettes    Start date: 03/23/1972    Quit date: 03/24/1983    Years since quitting: 39.7   Smokeless tobacco: Never  Vaping Use   Vaping status: Never Used  Substance and Sexual Activity   Alcohol use: No   Drug use: No   Sexual activity: Not Currently  Other Topics Concern   Not on file  Social History Narrative   Not on file   Social Determinants of Health   Financial Resource Strain: Not on file  Food Insecurity: Not on file  Transportation Needs: Not on file  Physical Activity: Not on file  Stress: Not on file  Social Connections: Not on file  Intimate Partner Violence: Not on file   Social History   Tobacco Use  Smoking Status Former   Current packs/day: 0.00   Average packs/day: 3.0 packs/day for 11.0 years (33.0 ttl pk-yrs)   Types: Cigarettes   Start date: 03/23/1972   Quit date: 03/24/1983   Years since quitting: 39.7  Smokeless Tobacco Never   Social History   Substance and Sexual Activity  Alcohol Use No    Family History:  Family History  Problem Relation Age of Onset   Cancer Father        lung    Past medical history, surgical history, medications, allergies, family history and social history reviewed with patient today and changes made to appropriate areas of the chart.    Review of Systems  Eyes:  Negative for blurred vision and double vision.  Respiratory:  Negative for shortness of breath.   Cardiovascular:  Negative for chest pain, palpitations and leg swelling.  Musculoskeletal:  Positive for myalgias.  Neurological:  Negative for dizziness and headaches.  Psychiatric/Behavioral:  Positive for depression. Negative for suicidal ideas. The patient is nervous/anxious.    All other ROS negative except what is listed above and in the HPI.      Objective:    There were no vitals taken for this  visit.  Wt Readings from Last 3 Encounters:  06/02/22 157 lb 1.6 oz (71.3 kg)  12/01/21 151 lb (68.5 kg)  09/13/21 157 lb (71.2 kg)    Physical Exam Vitals and nursing note reviewed.  Constitutional:      General: She is awake. She is not in acute distress.    Appearance: Normal appearance. She is well-developed and normal weight. She is not ill-appearing.  HENT:     Head: Normocephalic and atraumatic.     Right Ear: Hearing, tympanic membrane, ear canal and external ear normal. No drainage.     Left Ear: Hearing, tympanic membrane, ear canal and external ear normal. No drainage.     Nose: Nose normal.     Right Sinus: No maxillary sinus tenderness or frontal sinus tenderness.     Left Sinus: No maxillary sinus tenderness or frontal sinus tenderness.     Mouth/Throat:     Mouth: Mucous membranes are moist.     Pharynx: Oropharynx is clear. Uvula midline. No pharyngeal swelling, oropharyngeal exudate or posterior oropharyngeal erythema.  Eyes:     General: Lids are normal.        Right eye: No discharge.        Left eye: No discharge.     Extraocular Movements: Extraocular movements intact.     Conjunctiva/sclera: Conjunctivae normal.     Pupils: Pupils are equal, round, and reactive to light.     Visual Fields: Right eye visual fields normal and left eye visual fields normal.  Neck:     Thyroid: No thyromegaly.     Vascular: No carotid bruit.      Trachea: Trachea normal.  Cardiovascular:     Rate and Rhythm: Normal rate and regular rhythm.     Heart sounds: Normal heart sounds. No murmur heard.    No gallop.  Pulmonary:     Effort: Pulmonary effort is normal. No accessory muscle usage or respiratory distress.     Breath sounds: Normal breath sounds.  Chest:  Breasts:    Right: Normal.     Left: Normal.  Abdominal:     General: Bowel sounds are normal.     Palpations: Abdomen is soft. There is no hepatomegaly or splenomegaly.     Tenderness: There is no abdominal tenderness.  Musculoskeletal:        General: Normal range of motion.     Cervical back: Normal range of motion and neck supple.     Right lower leg: No edema.     Left lower leg: No edema.  Lymphadenopathy:     Head:     Right side of head: No submental, submandibular, tonsillar, preauricular or posterior auricular adenopathy.     Left side of head: No submental, submandibular, tonsillar, preauricular or posterior auricular adenopathy.     Cervical: No cervical adenopathy.     Upper Body:     Right upper body: No supraclavicular, axillary or pectoral adenopathy.     Left upper body: No supraclavicular, axillary or pectoral adenopathy.  Skin:    General: Skin is warm and dry.     Capillary Refill: Capillary refill takes less than 2 seconds.     Findings: No rash.  Neurological:     Mental Status: She is alert and oriented to person, place, and time.     Gait: Gait is intact.     Deep Tendon Reflexes: Reflexes are normal and symmetric.     Reflex Scores:  Brachioradialis reflexes are 2+ on the right side and 2+ on the left side.      Patellar reflexes are 2+ on the right side and 2+ on the left side. Psychiatric:        Attention and Perception: Attention normal.        Mood and Affect: Mood normal.        Speech: Speech normal.        Behavior: Behavior normal. Behavior is cooperative.        Thought Content: Thought content normal.        Judgment:  Judgment normal.     Results for orders placed or performed in visit on 06/02/22  Comp Met (CMET)  Result Value Ref Range   Glucose 77 70 - 99 mg/dL   BUN 18 8 - 27 mg/dL   Creatinine, Ser 1.61 0.57 - 1.00 mg/dL   eGFR 64 >09 UE/AVW/0.98   BUN/Creatinine Ratio 19 12 - 28   Sodium 140 134 - 144 mmol/L   Potassium 4.4 3.5 - 5.2 mmol/L   Chloride 104 96 - 106 mmol/L   CO2 23 20 - 29 mmol/L   Calcium 9.7 8.7 - 10.3 mg/dL   Total Protein 6.6 6.0 - 8.5 g/dL   Albumin 4.7 3.9 - 4.9 g/dL   Globulin, Total 1.9 1.5 - 4.5 g/dL   Albumin/Globulin Ratio 2.5 (H) 1.2 - 2.2   Bilirubin Total 0.3 0.0 - 1.2 mg/dL   Alkaline Phosphatase 75 44 - 121 IU/L   AST 20 0 - 40 IU/L   ALT 15 0 - 32 IU/L  Lipid Profile  Result Value Ref Range   Cholesterol, Total 195 100 - 199 mg/dL   Triglycerides 119 0 - 149 mg/dL   HDL 94 >14 mg/dL   VLDL Cholesterol Cal 18 5 - 40 mg/dL   LDL Chol Calc (NIH) 83 0 - 99 mg/dL   Chol/HDL Ratio 2.1 0.0 - 4.4 ratio      Assessment & Plan:   Problem List Items Addressed This Visit       Cardiovascular and Mediastinum   Hypertension - Primary     Respiratory   Mucopurulent chronic bronchitis (HCC)     Other   Depression, recurrent (HCC)   Hyperlipidemia     Follow up plan: No follow-ups on file.   LABORATORY TESTING:  - Pap smear: not applicable  IMMUNIZATIONS:   - Tdap: Tetanus vaccination status reviewed: last tetanus booster within 10 years. - Influenza: Postponed to flu season - Pneumovax: Refused - Prevnar: Refused - COVID: Not applicable - HPV: Not applicable - Shingrix vaccine: Refused  SCREENING: -Mammogram: Ordered today  - Colonoscopy: Ordered today  - Bone Density: Refused  -Hearing Test: Not applicable  -Spirometry: Not applicable   PATIENT COUNSELING:   Advised to take 1 mg of folate supplement per day if capable of pregnancy.   Sexuality: Discussed sexually transmitted diseases, partner selection, use of condoms, avoidance of  unintended pregnancy  and contraceptive alternatives.   Advised to avoid cigarette smoking.  I discussed with the patient that most people either abstain from alcohol or drink within safe limits (<=14/week and <=4 drinks/occasion for males, <=7/weeks and <= 3 drinks/occasion for females) and that the risk for alcohol disorders and other health effects rises proportionally with the number of drinks per week and how often a drinker exceeds daily limits.  Discussed cessation/primary prevention of drug use and availability of treatment for abuse.   Diet: Encouraged to  adjust caloric intake to maintain  or achieve ideal body weight, to reduce intake of dietary saturated fat and total fat, to limit sodium intake by avoiding high sodium foods and not adding table salt, and to maintain adequate dietary potassium and calcium preferably from fresh fruits, vegetables, and low-fat dairy products.    stressed the importance of regular exercise  Injury prevention: Discussed safety belts, safety helmets, smoke detector, smoking near bedding or upholstery.   Dental health: Discussed importance of regular tooth brushing, flossing, and dental visits.    NEXT PREVENTATIVE PHYSICAL DUE IN 1 YEAR. No follow-ups on file.

## 2022-12-05 ENCOUNTER — Encounter: Payer: Self-pay | Admitting: Nurse Practitioner

## 2022-12-05 ENCOUNTER — Ambulatory Visit (INDEPENDENT_AMBULATORY_CARE_PROVIDER_SITE_OTHER): Payer: Commercial Managed Care - PPO | Admitting: Nurse Practitioner

## 2022-12-05 ENCOUNTER — Other Ambulatory Visit: Payer: Self-pay | Admitting: Nurse Practitioner

## 2022-12-05 VITALS — BP 128/79 | HR 80 | Temp 98.4°F | Ht 63.0 in | Wt 158.0 lb

## 2022-12-05 DIAGNOSIS — Z0000 Encounter for general adult medical examination without abnormal findings: Secondary | ICD-10-CM

## 2022-12-05 DIAGNOSIS — F339 Major depressive disorder, recurrent, unspecified: Secondary | ICD-10-CM

## 2022-12-05 DIAGNOSIS — E782 Mixed hyperlipidemia: Secondary | ICD-10-CM | POA: Diagnosis not present

## 2022-12-05 DIAGNOSIS — I10 Essential (primary) hypertension: Secondary | ICD-10-CM | POA: Diagnosis not present

## 2022-12-05 DIAGNOSIS — J411 Mucopurulent chronic bronchitis: Secondary | ICD-10-CM | POA: Diagnosis not present

## 2022-12-05 LAB — URINALYSIS, ROUTINE W REFLEX MICROSCOPIC
Bilirubin, UA: NEGATIVE
Glucose, UA: NEGATIVE
Ketones, UA: NEGATIVE
Leukocytes,UA: NEGATIVE
Nitrite, UA: NEGATIVE
Protein,UA: NEGATIVE
RBC, UA: NEGATIVE
Specific Gravity, UA: >1.03 — ABNORMAL HIGH (ref 1.005–1.030)
Urobilinogen, Ur: 0.2 mg/dL (ref 0.2–1.0)
pH, UA: 5.5 (ref 5.0–7.5)

## 2022-12-05 MED ORDER — BUPROPION HCL ER (XL) 300 MG PO TB24: 300.0000 mg | ORAL_TABLET | Freq: Every day | ORAL | 1 refills | Status: DC

## 2022-12-05 MED ORDER — AMLODIPINE BESYLATE 10 MG PO TABS: 10.0000 mg | ORAL_TABLET | Freq: Every day | ORAL | 1 refills | Status: DC

## 2022-12-05 MED ORDER — ROSUVASTATIN CALCIUM 10 MG PO TABS: 10.0000 mg | ORAL_TABLET | Freq: Every day | ORAL | 1 refills | Status: DC

## 2022-12-05 MED ORDER — BREZTRI AEROSPHERE 160-9-4.8 MCG/ACT IN AERO: INHALATION_SPRAY | RESPIRATORY_TRACT | 3 refills | Status: AC

## 2022-12-05 MED ORDER — PANTOPRAZOLE SODIUM 40 MG PO TBEC: DELAYED_RELEASE_TABLET | ORAL | 1 refills | Status: DC

## 2022-12-05 MED ORDER — SERTRALINE HCL 100 MG PO TABS: 200.0000 mg | ORAL_TABLET | Freq: Every day | ORAL | 1 refills | Status: DC

## 2022-12-05 NOTE — Telephone Encounter (Signed)
Refused Protonix 40 mg because this is a duplicate.

## 2022-12-05 NOTE — Assessment & Plan Note (Signed)
Chronic.  Controlled.  Continue with Crestor 10mg .  Refills sent today.  Labs ordered today.  Return to clinic in 6 months for reevaluation.  Call sooner if concerns arise.

## 2022-12-05 NOTE — Assessment & Plan Note (Signed)
Chronic.  Controlled with Breztri.  No current concerns.  Continue with current medication regimen. Follow up in 6 months.  Call sooner if concerns arise.

## 2022-12-05 NOTE — Assessment & Plan Note (Signed)
Chronic.  Well controlled, despite PHQ9.  Continue with Zoloft 200mg  and Wellbutrin 300mg  daily.  Labs ordered today.  Return to clinic in 6 months for reevaluation.  Call sooner if concerns arise.

## 2022-12-05 NOTE — Assessment & Plan Note (Signed)
Chronic.  Controlled.  Continue with current medication regimen of Amlodipine 10mg .  Continue to check blood pressures at home and bring log to next visit. Refills sent today.  Labs ordered today.  Return to clinic in 6 months for reevaluation.  Call sooner if concerns arise.

## 2022-12-06 LAB — CBC WITH DIFFERENTIAL/PLATELET
Basophils Absolute: 0.1 10*3/uL (ref 0.0–0.2)
Basos: 1 %
EOS (ABSOLUTE): 0.2 10*3/uL (ref 0.0–0.4)
Eos: 4 %
Hematocrit: 45.1 % (ref 34.0–46.6)
Hemoglobin: 14.4 g/dL (ref 11.1–15.9)
Immature Grans (Abs): 0 10*3/uL (ref 0.0–0.1)
Immature Granulocytes: 0 %
Lymphocytes Absolute: 1.3 10*3/uL (ref 0.7–3.1)
Lymphs: 27 %
MCH: 30.8 pg (ref 26.6–33.0)
MCHC: 31.9 g/dL (ref 31.5–35.7)
MCV: 97 fL (ref 79–97)
Monocytes Absolute: 0.4 10*3/uL (ref 0.1–0.9)
Monocytes: 9 %
Neutrophils Absolute: 2.8 10*3/uL (ref 1.4–7.0)
Neutrophils: 59 %
Platelets: 194 10*3/uL (ref 150–450)
RBC: 4.67 x10E6/uL (ref 3.77–5.28)
RDW: 13.8 % (ref 11.7–15.4)
WBC: 4.8 10*3/uL (ref 3.4–10.8)

## 2022-12-06 LAB — COMPREHENSIVE METABOLIC PANEL
ALT: 11 [IU]/L (ref 0–32)
AST: 19 [IU]/L (ref 0–40)
Albumin: 4.7 g/dL (ref 3.9–4.9)
Alkaline Phosphatase: 76 [IU]/L (ref 44–121)
BUN/Creatinine Ratio: 14 (ref 12–28)
BUN: 15 mg/dL (ref 8–27)
Bilirubin Total: 0.3 mg/dL (ref 0.0–1.2)
CO2: 23 mmol/L (ref 20–29)
Calcium: 9.5 mg/dL (ref 8.7–10.3)
Chloride: 104 mmol/L (ref 96–106)
Creatinine, Ser: 1.1 mg/dL — ABNORMAL HIGH (ref 0.57–1.00)
Globulin, Total: 2.1 g/dL (ref 1.5–4.5)
Glucose: 90 mg/dL (ref 70–99)
Potassium: 4.1 mmol/L (ref 3.5–5.2)
Sodium: 142 mmol/L (ref 134–144)
Total Protein: 6.8 g/dL (ref 6.0–8.5)
eGFR: 55 mL/min/{1.73_m2} — ABNORMAL LOW (ref >59)

## 2022-12-06 LAB — LIPID PANEL
Chol/HDL Ratio: 2.2 {ratio} (ref 0.0–4.4)
Cholesterol, Total: 204 mg/dL — ABNORMAL HIGH (ref 100–199)
HDL: 91 mg/dL (ref >39)
LDL Chol Calc (NIH): 94 mg/dL (ref 0–99)
Triglycerides: 114 mg/dL (ref 0–149)
VLDL Cholesterol Cal: 19 mg/dL (ref 5–40)

## 2022-12-06 LAB — TSH: TSH: 2.25 u[IU]/mL (ref 0.450–4.500)

## 2022-12-06 NOTE — Progress Notes (Signed)
HI Krista Phelps. It was nice to see you yesterday.  Your lab work looks good.  Your kidney function is slightly down from prior.  Make sure you are drinking plenty of water (64 ounces daily) and avoid NSAIDS such as ibuprofen, motrin and aleve.  Cholesterol is slightly elevated.  I recommend a low fat diet and exercise. No other concerns at this time. Continue with your current medication regimen.  Follow up as discussed.  Please let me know if you have any questions.

## 2023-02-07 ENCOUNTER — Ambulatory Visit: Payer: Commercial Managed Care - PPO | Admitting: Internal Medicine

## 2023-02-07 ENCOUNTER — Encounter: Payer: Self-pay | Admitting: Internal Medicine

## 2023-02-07 VITALS — BP 104/68 | HR 82 | Temp 97.6°F | Ht 63.0 in | Wt 162.4 lb

## 2023-02-07 DIAGNOSIS — J301 Allergic rhinitis due to pollen: Secondary | ICD-10-CM | POA: Diagnosis not present

## 2023-02-07 DIAGNOSIS — J4520 Mild intermittent asthma, uncomplicated: Secondary | ICD-10-CM

## 2023-02-07 DIAGNOSIS — R053 Chronic cough: Secondary | ICD-10-CM

## 2023-02-07 NOTE — Progress Notes (Signed)
[facility] [place] [facility]monary Medicine Consultation      Date: 02/07/2023,   MRN# [id number] Krista Phelps 14-Jul-1955 Code Status:  Code Status History     Date Active Date Inactive Code Status Order ID Comments User Context   04/02/2018 1245 04/05/2018 1800 Full Code [id number]  Kennedy Bucker, MD Inpatient      Questions for Most Recent Historical Code Status (Order [id number])          Admission                  Current  Krista Phelps is a 67 y.o. old female seen in consultation for cough  at the request of Caren Griffins.   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 She has 11 cats in home    02/07/2023  Pulmonary function test 2022 reviewed in detail FEV1 110% FEV1 FVC ratio 86% predicted FVC 98% predicted No significant bronchodilator response DLCO was 84% predicted normal Flow volume loops and spirometry within normal limits    CHIEF COMPLAINT:   Follow-up assessment for chronic cough Follow-up assessment for chronic allergic rhinitis Follow-up assessment for intermittent reactive airways disease   HISTORY OF PRESENT ILLNESS     No exacerbation at this time No evidence of heart failure at this time No evidence or signs of infection at this time No respiratory distress No fevers, chills, nausea, vomiting, diarrhea No evidence of lower extremity edema No evidence hemoptysis  Patient requested Breztri inhaler  Uses  Breztri as needed  GERD seems to be under control  No signs of infection at this time   Cough is improved  Sinus congestion improved   Pulmonary function test reviewed in detail with patient  Previous chest x-ray reviewed in detail with patient  02/07/2023  Pulmonary function test 2022 reviewed in detail FEV1 110% FEV1 FVC ratio 86% predicted FVC 98% predicted No significant bronchodilator response DLCO was 84% predicted normal Flow volume loops and spirometry within normal limits  MEDICATIONS    Home Medication:  Current Outpatient Rx   Order #: [id number] Class: Normal   Order #: [id number] Class: Historical Med   Order #: [id number] Class: Normal   Order #: [id number] Class: Normal   Order #: [id number] Class: Historical Med   Order #: [id number] Class: Historical Med   Order #: [id number] Class: Historical Med   Order #: [id number] Class: Historical Med   Order #: [id number] Class: Normal   Order #: [id number] Class: Historical Med   Order #: [id number] Class: Normal   Order #: [id number] Class: Normal   Order #: [id number] Class: Normal    Current Medication:  Current Outpatient Medications:    amLODipine (NORVASC) 10 MG tablet, Take 1 tablet (10 mg total) by mouth daily., Disp: 90 tablet, Rfl: 1   aspirin 81 MG tablet, Take 81 mg by mouth daily., Disp: , Rfl:    Budeson-Glycopyrrol-Formoterol (BREZTRI AEROSPHERE) 160-9-4.8 MCG/ACT AERO, INHALE 2 PUFFS IN THE MORNING 2 AT BEDTIME, Disp: 11 g, Rfl: 3  buPROPion (WELLBUTRIN XL) 300 MG 24 hr tablet, Take 1 tablet (300 mg total) by mouth daily., Disp: 90 tablet, Rfl: 1   calcium carbonate (TUMS - DOSED IN MG ELEMENTAL CALCIUM) 500 MG chewable tablet, Chew 1 tablet by mouth daily as needed for indigestion or heartburn. , Disp: , Rfl:    Homeopathic Products (LEG CRAMPS PO), Take 4 tablets by mouth daily., Disp: , Rfl:    Magnesium 250 MG TABS, Take 1,000 mg by mouth daily as needed (for cramps)., Disp: , Rfl:    Melatonin 10 MG TABS, Take 20 mg  by mouth at bedtime., Disp: , Rfl:    meloxicam (MOBIC) 15 MG tablet, Take 1 tablet by mouth once daily, Disp: 90 tablet, Rfl: 0   Multiple Vitamins-Minerals (PRESERVISION AREDS 2 PO), Take 2 tablets by mouth daily., Disp: , Rfl:    pantoprazole (PROTONIX) 40 MG tablet, Take 1 tablet by mouth twice daily as needed, Disp: 180 tablet, Rfl: 1   rosuvastatin (CRESTOR) 10 MG tablet, Take 1 tablet (10 mg total) by mouth daily., Disp: 90 tablet, Rfl: 1   sertraline (ZOLOFT) 100 MG tablet, Take 2 tablets (200 mg total) by mouth daily., Disp: 180 tablet, Rfl: 1    ALLERGIES   Atropine     REVIEW OF SYSTEMS    BP 104/68 (BP Location: Left Arm, Patient Position: Sitting, Cuff Size: Normal)   Pulse 82   Temp 97.6 F (36.4 C) (Temporal)   Ht 5\' 3"  (1.6 m)   Wt 162 lb 6.4 oz (73.7 kg)   SpO2 98%   BMI 28.77 kg/m       Review of Systems: Gen:  Denies  fever, sweats, chills weight loss  HEENT: Denies blurred vision, double vision, ear pain, eye pain, hearing loss, nose bleeds, sore throat Cardiac:  No dizziness, chest pain or heaviness, chest tightness,edema, No JVD Resp:   No cough, -sputum production, -shortness of breath,-wheezing, -hemoptysis,  Other:  All other systems negative   Physical Examination:   General Appearance: No distress  EYES PERRLA, EOM intact.   NECK Supple, No JVD Pulmonary: normal breath sounds, No wheezing.  CardiovascularNormal S1,S2.  No m/r/g.   Abdomen: Benign, Soft, non-tender. Neurology UE/LE 5/5 strength, no focal deficits Ext pulses intact, cap refill intact ALL OTHER ROS ARE NEGATIVE         ASSESSMENT/PLAN   67 year old pleasant white female seen today for follow-up assessment for chronic cough  Based on my previous documentation and findings this could be multifactorial with GERD as well as sinus issues along with allergic rhinitis    Patient has signs symptoms of chronic productive cough for many years  Intermittent reactive  airways disease Findings could be related to chronic bronchitis without obstructive pattern  Bronchiectasis is also a possibility  Patient was started on Breztri and now uses as needed  Avoid secondhand smoke Avoid SICK contacts Recommend  Masking  when appropriate Recommend Keep up-to-date with vaccinations   History of sinus issues Follow-up ENT as needed  GERD Continue PPI  Previous chest x-ray and pulmonary function test reviewed in detail with patient  No significant abnormalities noted   Patient with a previous history of meningioma  Follow-up neurology assessment   MEDICATION ADJUSTMENTS/LABS AND TESTS ORDERED: Continue Breztri as needed  Continue medications for reflux disease  Follow-up ENT for sinus problems   Avoid secondhand smoke Avoid SICK contacts Recommend  Masking  when appropriate Recommend Keep up-to-date with vaccinations   CURRENT MEDICATIONS REVIEWED  AT LENGTH WITH PATIENT TODAY   Patient  satisfied with Plan of action and management. All questions answered  Follow-up in 1 year Total time spent 32 minutes    Lucie Leather, M.D.  [facility]  Medical Director [facility] Medical Director [facility] Cardio-Pulmonary Department

## 2023-02-07 NOTE — Patient Instructions (Signed)
Continue to use Breztri as needed  Avoid secondhand smoke Avoid SICK contacts Recommend  Masking  when appropriate Recommend Keep up-to-date with vaccinations

## 2023-02-17 ENCOUNTER — Other Ambulatory Visit: Payer: Self-pay | Admitting: Physician Assistant

## 2023-02-20 NOTE — Telephone Encounter (Signed)
Requested Prescriptions  Pending Prescriptions Disp Refills   meloxicam (MOBIC) 15 MG tablet [Pharmacy Med Name: Meloxicam 15 MG Oral Tablet] 90 tablet 0    Sig: Take 1 tablet by mouth once daily     Analgesics:  COX2 Inhibitors Failed - 02/20/2023  7:48 AM      Failed - Manual Review: Labs are only required if the patient has taken medication for more than 8 weeks.      Failed - Cr in normal range and within 360 days    Creatinine, Ser  Date Value Ref Range Status  12/05/2022 1.10 (H) 0.57 - 1.00 mg/dL Final         Passed - HGB in normal range and within 360 days    Hemoglobin  Date Value Ref Range Status  12/05/2022 14.4 11.1 - 15.9 g/dL Final         Passed - HCT in normal range and within 360 days    Hematocrit  Date Value Ref Range Status  12/05/2022 45.1 34.0 - 46.6 % Final         Passed - AST in normal range and within 360 days    AST  Date Value Ref Range Status  12/05/2022 19 0 - 40 IU/L Final         Passed - ALT in normal range and within 360 days    ALT  Date Value Ref Range Status  12/05/2022 11 0 - 32 IU/L Final         Passed - eGFR is 30 or above and within 360 days    GFR calc Af Amer  Date Value Ref Range Status  03/17/2019 78 >59 mL/min/1.73 Final   GFR calc non Af Amer  Date Value Ref Range Status  03/17/2019 68 >59 mL/min/1.73 Final   eGFR  Date Value Ref Range Status  12/05/2022 55 (L) >59 mL/min/1.73 Final         Passed - Patient is not pregnant      Passed - Valid encounter within last 12 months    Recent Outpatient Visits           2 months ago Annual physical exam   [place] [facility] Larae Grooms, NP   8 months ago Mixed hyperlipidemia   [place] [facility] Larae Grooms, NP   1 year ago Depression, recurrent [facility])   [facility] Larae Grooms, NP   1 year ago Primary hypertension   [place] [facility] Larae Grooms, NP   1 year  ago Annual physical exam   [place] [facility] Larae Grooms, NP       Future Appointments             In 3 months Larae Grooms, NP [facility]

## 2023-06-02 ENCOUNTER — Other Ambulatory Visit: Payer: Self-pay | Admitting: Nurse Practitioner

## 2023-06-04 NOTE — Telephone Encounter (Signed)
 Requested Prescriptions  Pending Prescriptions Disp Refills   meloxicam (MOBIC) 15 MG tablet [Pharmacy Med Name: Meloxicam 15 MG Oral Tablet] 90 tablet 0    Sig: Take 1 tablet by mouth once daily     Analgesics:  COX2 Inhibitors Failed - 06/04/2023  3:01 PM      Failed - Manual Review: Labs are only required if the patient has taken medication for more than 8 weeks.      Failed - Cr in normal range and within 360 days    Creatinine, Ser  Date Value Ref Range Status  12/05/2022 1.10 (H) 0.57 - 1.00 mg/dL Final         Failed - Valid encounter within last 12 months    Recent Outpatient Visits   None     Future Appointments             Tomorrow Krista Grooms, NP [facility]            Passed - HGB in normal range and within 360 days    Hemoglobin  Date Value Ref Range Status  12/05/2022 14.4 11.1 - 15.9 g/dL Final         Passed - HCT in normal range and within 360 days    Hematocrit  Date Value Ref Range Status  12/05/2022 45.1 34.0 - 46.6 % Final         Passed - AST in normal range and within 360 days    AST  Date Value Ref Range Status  12/05/2022 19 0 - 40 IU/L Final         Passed - ALT in normal range and within 360 days    ALT  Date Value Ref Range Status  12/05/2022 11 0 - 32 IU/L Final         Passed - eGFR is 30 or above and within 360 days    GFR calc Af Amer  Date Value Ref Range Status  03/17/2019 78 >59 mL/min/1.73 Final   GFR calc non Af Amer  Date Value Ref Range Status  03/17/2019 68 >59 mL/min/1.73 Final   eGFR  Date Value Ref Range Status  12/05/2022 55 (L) >59 mL/min/1.73 Final         Passed - Patient is not pregnant       buPROPion (WELLBUTRIN XL) 300 MG 24 hr tablet [Pharmacy Med Name: buPROPion HCl ER (XL) 300 MG Oral Tablet Extended Release 24 Hour] 90 tablet 0    Sig: Take 1 tablet by mouth once daily     Psychiatry: Antidepressants - bupropion Failed - 06/04/2023  3:01 PM      Failed - Cr  in normal range and within 360 days    Creatinine, Ser  Date Value Ref Range Status  12/05/2022 1.10 (H) 0.57 - 1.00 mg/dL Final         Failed - Valid encounter within last 6 months    Recent Outpatient Visits   None     Future Appointments             Tomorrow Krista Grooms, NP [facility]            Passed - AST in normal range and within 360 days    AST  Date Value Ref Range Status  12/05/2022 19 0 - 40 IU/L Final         Passed - ALT in normal range and within 360 days  ALT  Date Value Ref Range Status  12/05/2022 11 0 - 32 IU/L Final         Passed - Completed PHQ-2 or PHQ-9 in the last 360 days      Passed - Last BP in normal range    BP Readings from Last 1 Encounters:  02/07/23 104/68          sertraline (ZOLOFT) 100 MG tablet [Pharmacy Med Name: Sertraline HCl 100 MG Oral Tablet] 180 tablet 0    Sig: Take 2 tablets by mouth once daily     Psychiatry:  Antidepressants - SSRI - sertraline Failed - 06/04/2023  3:01 PM      Failed - Valid encounter within last 6 months    Recent Outpatient Visits   None     Future Appointments             Tomorrow Krista Grooms, NP [facility]            Passed - AST in normal range and within 360 days    AST  Date Value Ref Range Status  12/05/2022 19 0 - 40 IU/L Final         Passed - ALT in normal range and within 360 days    ALT  Date Value Ref Range Status  12/05/2022 11 0 - 32 IU/L Final         Passed - Completed PHQ-2 or PHQ-9 in the last 360 days

## 2023-06-05 ENCOUNTER — Encounter: Payer: Self-pay | Admitting: Nurse Practitioner

## 2023-06-05 ENCOUNTER — Ambulatory Visit: Payer: Self-pay | Admitting: Nurse Practitioner

## 2023-06-05 VITALS — BP 109/72 | HR 82 | Temp 98.2°F | Resp 14 | Ht 62.99 in | Wt 154.4 lb

## 2023-06-05 DIAGNOSIS — I10 Essential (primary) hypertension: Secondary | ICD-10-CM | POA: Diagnosis not present

## 2023-06-05 DIAGNOSIS — H353290 Exudative age-related macular degeneration, unspecified eye, stage unspecified: Secondary | ICD-10-CM | POA: Insufficient documentation

## 2023-06-05 DIAGNOSIS — J411 Mucopurulent chronic bronchitis: Secondary | ICD-10-CM | POA: Diagnosis not present

## 2023-06-05 DIAGNOSIS — H353220 Exudative age-related macular degeneration, left eye, stage unspecified: Secondary | ICD-10-CM | POA: Diagnosis not present

## 2023-06-05 DIAGNOSIS — F339 Major depressive disorder, recurrent, unspecified: Secondary | ICD-10-CM | POA: Diagnosis not present

## 2023-06-05 DIAGNOSIS — E782 Mixed hyperlipidemia: Secondary | ICD-10-CM

## 2023-06-05 MED ORDER — ROSUVASTATIN CALCIUM 10 MG PO TABS: 10.0000 mg | ORAL_TABLET | Freq: Every day | ORAL | 1 refills | Status: DC

## 2023-06-05 MED ORDER — SERTRALINE HCL 100 MG PO TABS: 200.0000 mg | ORAL_TABLET | Freq: Every day | ORAL | 1 refills | Status: DC

## 2023-06-05 MED ORDER — METHYLPREDNISOLONE 4 MG PO TBPK: ORAL_TABLET | ORAL | 0 refills | Status: DC

## 2023-06-05 MED ORDER — AMOXICILLIN 500 MG PO CAPS: 500.0000 mg | ORAL_CAPSULE | Freq: Two times a day (BID) | ORAL | 0 refills | Status: AC

## 2023-06-05 MED ORDER — BUPROPION HCL ER (XL) 300 MG PO TB24: 300.0000 mg | ORAL_TABLET | Freq: Every day | ORAL | 1 refills | Status: DC

## 2023-06-05 MED ORDER — AMLODIPINE BESYLATE 10 MG PO TABS: 10.0000 mg | ORAL_TABLET | Freq: Every day | ORAL | 1 refills | Status: DC

## 2023-06-05 NOTE — Assessment & Plan Note (Signed)
 Chronic.  Exacerbated at this time.  Will treat with amoxicillin and Prednisone.  Follow up if not improved.  Continue with Breztri as prescribed.

## 2023-06-05 NOTE — Assessment & Plan Note (Addendum)
 Chronic.  Controlled.  Continue with current medication regimen of Amlodipine 10mg .  Recommend checking blood pressures at home and bring log to next visit. Refills sent today.  Labs ordered today.  Return to clinic in 6 months for reevaluation.  Call sooner if concerns arise.

## 2023-06-05 NOTE — Assessment & Plan Note (Signed)
 Chronic.  Well controlled, despite PHQ9.  Continue with Zoloft 200mg  and Wellbutrin 300mg  daily.  Labs ordered today.  Refills sent today.  Return to clinic in 6 months for reevaluation.  Call sooner if concerns arise.

## 2023-06-05 NOTE — Assessment & Plan Note (Signed)
Chronic.  Controlled.  Continue with Crestor 10mg .  Refills sent today.  Labs ordered today.  Return to clinic in 6 months for reevaluation.  Call sooner if concerns arise.

## 2023-06-05 NOTE — Progress Notes (Signed)
 BP 109/72 (BP Location: Left Arm, Patient Position: Sitting, Cuff Size: Normal)   Pulse 82   Temp 98.2 F (36.8 C) (Oral)   Resp 14   Ht 5' 2.99" (1.6 m)   Wt 154 lb 6.4 oz (70 kg)   SpO2 95%   BMI 27.36 kg/m    Subjective:    Patient ID: Krista Phelps, female    DOB: 1955-09-15, 68 y.o.   MRN: [id number]  HPI: Krista Phelps is a 68 y.o. female  Chief Complaint  Patient presents with   Hyperlipidemia   Hypertension    Doing ok.    Mild intermittent reactive airway disease without complicat    Went on a cruise and came back sick. Coughing, head cold but feels it is resolving.    Foot Swelling    Left>right. Couch potato so tends to always have feet elevated. Was pitting when she first noticed    HYPERTENSION / HYPERLIPIDEMIA Satisfied with current treatment? yes Duration of hypertension: years BP monitoring frequency: not checking BP range:  BP medication side effects: no Past BP meds: amlodipine Duration of hyperlipidemia: years Cholesterol medication side effects: no Cholesterol supplements: none Past cholesterol medications: rosuvastatin (crestor) Medication compliance: excellent compliance Aspirin: no Recent stressors: no Recurrent headaches: no Visual changes: no Palpitations: no Dyspnea: yes Chest pain: no Lower extremity edema: yes Dizzy/lightheaded: occasional  MOOD Patient states she feels like her mood has improved.  She does feel like she sits on the couch too much.  States she is working on it.  Denies SI. She recently went on a cruise.    COPD See's pulmonology. COPD status: controlled Satisfied with current treatment?: yes Oxygen use: no Dyspnea frequency: with exercise Cough frequency: some Rescue inhaler frequency:  not using Limitation of activity: yes Productive cough:  Last Spirometry:  Pneumovax: Not up to Date Influenza: Not up to Date  UPPER RESPIRATORY TRACT INFECTION Worst symptom: 8 days ago Fever: no Cough:  yes Shortness of breath: yes Wheezing: yes Chest pain: no Chest tightness: no Chest congestion: yes Nasal congestion: yes Runny nose: yes Post nasal drip: yes Sneezing: no Sore throat: yes Swollen glands: no Sinus pressure: no Headache: no Face pain: no Toothache: no Ear pain: no bilateral Ear pressure: no bilateral Eyes red/itching:no Eye drainage/crusting: no  Vomiting: no Rash: no Fatigue: yes Sick contacts: no Strep contacts: no  Context: better Recurrent sinusitis: no Relief with OTC cold/cough medications: no Treatments attempted: none    Relevant past medical, surgical, family and social history reviewed and updated as indicated. Interim medical history since our last visit reviewed. Allergies and medications reviewed and updated.  Review of Systems  Constitutional:  Positive for fatigue. Negative for fever.  HENT:  Positive for congestion, postnasal drip, rhinorrhea and sore throat. Negative for dental problem, ear pain, sinus pressure, sinus pain and sneezing.   Eyes:  Negative for visual disturbance.  Respiratory:  Positive for cough, shortness of breath and wheezing. Negative for chest tightness.   Cardiovascular:  Positive for leg swelling. Negative for chest pain and palpitations.  Gastrointestinal:  Negative for vomiting.  Skin:  Negative for rash.  Neurological:  Negative for dizziness and headaches.    Per HPI unless specifically indicated above     Objective:    BP 109/72 (BP Location: Left Arm, Patient Position: Sitting, Cuff Size: Normal)   Pulse 82   Temp 98.2 F (36.8 C) (Oral)   Resp 14   Ht 5' 2.99" (1.6 m)  Wt 154 lb 6.4 oz (70 kg)   SpO2 95%   BMI 27.36 kg/m   Wt Readings from Last 3 Encounters:  06/05/23 154 lb 6.4 oz (70 kg)  02/07/23 162 lb 6.4 oz (73.7 kg)  12/05/22 158 lb (71.7 kg)    Physical Exam Vitals and nursing note reviewed.  Constitutional:      General: She is not in acute distress.    Appearance: Normal  appearance. She is normal weight. She is not ill-appearing, toxic-appearing or diaphoretic.  HENT:     Head: Normocephalic.     Right Ear: External ear normal.     Left Ear: External ear normal.     Nose: Nose normal.     Mouth/Throat:     Mouth: Mucous membranes are moist.     Pharynx: Oropharynx is clear.  Eyes:     General:        Right eye: No discharge.        Left eye: No discharge.     Extraocular Movements: Extraocular movements intact.     Conjunctiva/sclera: Conjunctivae normal.     Pupils: Pupils are equal, round, and reactive to light.  Cardiovascular:     Rate and Rhythm: Normal rate and regular rhythm.     Heart sounds: No murmur heard. Pulmonary:     Effort: Pulmonary effort is normal. No respiratory distress.     Breath sounds: Wheezing and rhonchi present. No rales.  Musculoskeletal:     Cervical back: Normal range of motion and neck supple.  Skin:    General: Skin is warm and dry.     Capillary Refill: Capillary refill takes less than 2 seconds.  Neurological:     General: No focal deficit present.     Mental Status: She is alert and oriented to person, place, and time. Mental status is at baseline.  Psychiatric:        Mood and Affect: Mood normal.        Behavior: Behavior normal.        Thought Content: Thought content normal.        Judgment: Judgment normal.     Results for orders placed or performed in visit on 12/05/22  Urinalysis, Routine w reflex microscopic   Collection Time: 12/05/22  9:14 AM  Result Value Ref Range   Specific Gravity, UA >1.030 (H) 1.005 - 1.030   pH, UA 5.5 5.0 - 7.5   Color, UA Yellow Yellow   Appearance Ur Clear Clear   Leukocytes,UA Negative Negative   Protein,UA Negative Negative/Trace   Glucose, UA Negative Negative   Ketones, UA Negative Negative   RBC, UA Negative Negative   Bilirubin, UA Negative Negative   Urobilinogen, Ur 0.2 0.2 - 1.0 mg/dL   Nitrite, UA Negative Negative   Microscopic Examination Comment    CBC with Differential/Platelet   Collection Time: 12/05/22  9:15 AM  Result Value Ref Range   WBC 4.8 3.4 - 10.8 x10E3/uL   RBC 4.67 3.77 - 5.28 x10E6/uL   Hemoglobin 14.4 11.1 - 15.9 g/dL   Hematocrit 40.1 02.7 - 46.6 %   MCV 97 79 - 97 fL   MCH 30.8 26.6 - 33.0 pg   MCHC 31.9 31.5 - 35.7 g/dL   RDW 25.3 66.4 - 40.3 %   Platelets 194 150 - 450 x10E3/uL   Neutrophils 59 Not Estab. %   Lymphs 27 Not Estab. %   Monocytes 9 Not Estab. %   Eos 4 Not  Estab. %   Basos 1 Not Estab. %   Neutrophils Absolute 2.8 1.4 - 7.0 x10E3/uL   Lymphocytes Absolute 1.3 0.7 - 3.1 x10E3/uL   Monocytes Absolute 0.4 0.1 - 0.9 x10E3/uL   EOS (ABSOLUTE) 0.2 0.0 - 0.4 x10E3/uL   Basophils Absolute 0.1 0.0 - 0.2 x10E3/uL   Immature Granulocytes 0 Not Estab. %   Immature Grans (Abs) 0.0 0.0 - 0.1 x10E3/uL  Comprehensive metabolic panel   Collection Time: 12/05/22  9:15 AM  Result Value Ref Range   Glucose 90 70 - 99 mg/dL   BUN 15 8 - 27 mg/dL   Creatinine, Ser 1.61 (H) 0.57 - 1.00 mg/dL   eGFR 55 (L) >09 UE/AVW/0.98   BUN/Creatinine Ratio 14 12 - 28   Sodium 142 134 - 144 mmol/L   Potassium 4.1 3.5 - 5.2 mmol/L   Chloride 104 96 - 106 mmol/L   CO2 23 20 - 29 mmol/L   Calcium 9.5 8.7 - 10.3 mg/dL   Total Protein 6.8 6.0 - 8.5 g/dL   Albumin 4.7 3.9 - 4.9 g/dL   Globulin, Total 2.1 1.5 - 4.5 g/dL   Bilirubin Total 0.3 0.0 - 1.2 mg/dL   Alkaline Phosphatase 76 44 - 121 IU/L   AST 19 0 - 40 IU/L   ALT 11 0 - 32 IU/L  Lipid panel   Collection Time: 12/05/22  9:15 AM  Result Value Ref Range   Cholesterol, Total 204 (H) 100 - 199 mg/dL   Triglycerides 119 0 - 149 mg/dL   HDL 91 >14 mg/dL   VLDL Cholesterol Cal 19 5 - 40 mg/dL   LDL Chol Calc (NIH) 94 0 - 99 mg/dL   Chol/HDL Ratio 2.2 0.0 - 4.4 ratio  TSH   Collection Time: 12/05/22  9:15 AM  Result Value Ref Range   TSH 2.250 0.450 - 4.500 uIU/mL      Assessment & Plan:   Problem List Items Addressed This Visit       Cardiovascular and  Mediastinum   Hypertension   Chronic.  Controlled.  Continue with current medication regimen of Amlodipine 10mg .  Recommend checking blood pressures at home and bring log to next visit. Refills sent today.  Labs ordered today.  Return to clinic in 6 months for reevaluation.  Call sooner if concerns arise.       Relevant Medications   amLODipine (NORVASC) 10 MG tablet   rosuvastatin (CRESTOR) 10 MG tablet   Other Relevant Orders   Comp Met (CMET)     Respiratory   Mucopurulent chronic bronchitis (HCC)   Chronic.  Exacerbated at this time.  Will treat with amoxicillin and Prednisone.  Follow up if not improved.  Continue with Breztri as prescribed.         Other   Depression, recurrent (HCC) - Primary   Chronic.  Well controlled, despite PHQ9.  Continue with Zoloft 200mg  and Wellbutrin 300mg  daily.  Labs ordered today.  Refills sent today.  Return to clinic in 6 months for reevaluation.  Call sooner if concerns arise.       Relevant Medications   buPROPion (WELLBUTRIN XL) 300 MG 24 hr tablet   sertraline (ZOLOFT) 100 MG tablet   Hyperlipidemia   Chronic.  Controlled.  Continue with Crestor 10mg .  Refills sent today.  Labs ordered today.  Return to clinic in 6 months for reevaluation.  Call sooner if concerns arise.       Relevant Medications   amLODipine (NORVASC)  10 MG tablet   rosuvastatin (CRESTOR) 10 MG tablet   Other Relevant Orders   Lipid Profile   Macular degeneration, wet (HCC)   Seeing an Ophthalmologist in [place] and receiving injections.         Follow up plan: Return in about 6 months (around 12/05/2023) for Physical and Fasting labs.

## 2023-06-05 NOTE — Assessment & Plan Note (Signed)
 Seeing an Ophthalmologist in [place] and receiving injections.

## 2023-06-06 ENCOUNTER — Encounter: Payer: Self-pay | Admitting: Nurse Practitioner

## 2023-06-06 LAB — LIPID PANEL
Chol/HDL Ratio: 2.4 ratio (ref 0.0–4.4)
Cholesterol, Total: 163 mg/dL (ref 100–199)
HDL: 68 mg/dL (ref >39)
LDL Chol Calc (NIH): 80 mg/dL (ref 0–99)
Triglycerides: 78 mg/dL (ref 0–149)
VLDL Cholesterol Cal: 15 mg/dL (ref 5–40)

## 2023-06-06 LAB — COMPREHENSIVE METABOLIC PANEL WITH GFR
ALT: 22 IU/L (ref 0–32)
AST: 25 IU/L (ref 0–40)
Albumin: 4.4 g/dL (ref 3.9–4.9)
Alkaline Phosphatase: 75 IU/L (ref 44–121)
BUN/Creatinine Ratio: 11 — ABNORMAL LOW (ref 12–28)
BUN: 12 mg/dL (ref 8–27)
Bilirubin Total: 0.3 mg/dL (ref 0.0–1.2)
CO2: 22 mmol/L (ref 20–29)
Calcium: 9.2 mg/dL (ref 8.7–10.3)
Chloride: 106 mmol/L (ref 96–106)
Creatinine, Ser: 1.14 mg/dL — ABNORMAL HIGH (ref 0.57–1.00)
Globulin, Total: 2.2 g/dL (ref 1.5–4.5)
Glucose: 92 mg/dL (ref 70–99)
Potassium: 3.8 mmol/L (ref 3.5–5.2)
Sodium: 144 mmol/L (ref 134–144)
Total Protein: 6.6 g/dL (ref 6.0–8.5)
eGFR: 52 mL/min/{1.73_m2} — ABNORMAL LOW (ref >59)

## 2023-11-08 ENCOUNTER — Other Ambulatory Visit: Payer: Self-pay | Admitting: Nurse Practitioner

## 2023-11-09 NOTE — Telephone Encounter (Signed)
 Requested Prescriptions  Pending Prescriptions Disp Refills   meloxicam  (MOBIC ) 15 MG tablet [Pharmacy Med Name: Meloxicam  15 MG Oral Tablet] 90 tablet 0    Sig: Take 1 tablet by mouth once daily     Analgesics:  COX2 Inhibitors Failed - 11/09/2023 11:27 AM      Failed - Manual Review: Labs are only required if the patient has taken medication for more than 8 weeks.      Failed - Cr in normal range and within 360 days    Creatinine, Ser  Date Value Ref Range Status  06/05/2023 1.14 (H) 0.57 - 1.00 mg/dL Final         Passed - HGB in normal range and within 360 days    Hemoglobin  Date Value Ref Range Status  12/05/2022 14.4 11.1 - 15.9 g/dL Final         Passed - HCT in normal range and within 360 days    Hematocrit  Date Value Ref Range Status  12/05/2022 45.1 34.0 - 46.6 % Final         Passed - AST in normal range and within 360 days    AST  Date Value Ref Range Status  06/05/2023 25 0 - 40 IU/L Final         Passed - ALT in normal range and within 360 days    ALT  Date Value Ref Range Status  06/05/2023 22 0 - 32 IU/L Final         Passed - eGFR is 30 or above and within 360 days    GFR calc Af Amer  Date Value Ref Range Status  03/17/2019 78 >59 mL/min/1.73 Final   GFR calc non Af Amer  Date Value Ref Range Status  03/17/2019 68 >59 mL/min/1.73 Final   eGFR  Date Value Ref Range Status  06/05/2023 52 (L) >59 mL/min/1.73 Final         Passed - Patient is not pregnant      Passed - Valid encounter within last 12 months    Recent Outpatient Visits           5 months ago Depression, recurrent [facility])   [facility] Melvin Pao, NP

## 2023-12-10 ENCOUNTER — Encounter: Payer: Self-pay | Admitting: Nurse Practitioner

## 2023-12-10 ENCOUNTER — Ambulatory Visit (INDEPENDENT_AMBULATORY_CARE_PROVIDER_SITE_OTHER): Admitting: Nurse Practitioner

## 2023-12-10 VITALS — BP 124/77 | HR 70 | Temp 98.1°F | Ht 63.0 in | Wt 151.0 lb

## 2023-12-10 DIAGNOSIS — Z0000 Encounter for general adult medical examination without abnormal findings: Secondary | ICD-10-CM

## 2023-12-10 DIAGNOSIS — H353220 Exudative age-related macular degeneration, left eye, stage unspecified: Secondary | ICD-10-CM

## 2023-12-10 DIAGNOSIS — Z1231 Encounter for screening mammogram for malignant neoplasm of breast: Secondary | ICD-10-CM

## 2023-12-10 DIAGNOSIS — I10 Essential (primary) hypertension: Secondary | ICD-10-CM | POA: Diagnosis not present

## 2023-12-10 DIAGNOSIS — J411 Mucopurulent chronic bronchitis: Secondary | ICD-10-CM | POA: Diagnosis not present

## 2023-12-10 DIAGNOSIS — E782 Mixed hyperlipidemia: Secondary | ICD-10-CM

## 2023-12-10 DIAGNOSIS — F339 Major depressive disorder, recurrent, unspecified: Secondary | ICD-10-CM

## 2023-12-10 MED ORDER — AMLODIPINE BESYLATE 10 MG PO TABS: 10.0000 mg | ORAL_TABLET | Freq: Every day | ORAL | 1 refills | Status: AC

## 2023-12-10 MED ORDER — SERTRALINE HCL 100 MG PO TABS: 200.0000 mg | ORAL_TABLET | Freq: Every day | ORAL | 1 refills | Status: AC

## 2023-12-10 MED ORDER — BUPROPION HCL ER (XL) 300 MG PO TB24: 300.0000 mg | ORAL_TABLET | Freq: Every day | ORAL | 1 refills | Status: AC

## 2023-12-10 MED ORDER — ROSUVASTATIN CALCIUM 10 MG PO TABS: 10.0000 mg | ORAL_TABLET | Freq: Every day | ORAL | 1 refills | Status: AC

## 2023-12-10 MED ORDER — PANTOPRAZOLE SODIUM 40 MG PO TBEC: DELAYED_RELEASE_TABLET | ORAL | 1 refills | Status: AC

## 2023-12-10 NOTE — Assessment & Plan Note (Signed)
Chronic.  Controlled.  Continue with Crestor 10mg .  Refills sent today.  Labs ordered today.  Return to clinic in 6 months for reevaluation.  Call sooner if concerns arise.

## 2023-12-10 NOTE — Assessment & Plan Note (Signed)
 Chronic.  Well controlled, PHQ9 improved from last year.  Continue with Zoloft  200mg  and Wellbutrin  300mg  daily.  Labs ordered today.  Refills sent today.  Return to clinic in 6 months for reevaluation.  Call sooner if concerns arise.

## 2023-12-10 NOTE — Progress Notes (Signed)
 BP 124/77   Pulse 70   Temp 98.1 F (36.7 C) (Oral)   Ht 5' 3 (1.6 m)   Wt 151 lb (68.5 kg)   SpO2 96%   BMI 26.75 kg/m    Subjective:    Patient ID: Krista Phelps, female    DOB: 28-May-1955, 68 y.o.   MRN: [id number]  HPI: Krista Phelps is a 68 y.o. female presenting on 12/10/2023 for comprehensive medical examination. Current medical complaints include:none  She currently lives with: Menopausal Symptoms: no  HYPERTENSION / HYPERLIPIDEMIA Satisfied with current treatment? yes Duration of hypertension: years BP monitoring frequency: not checking BP range:  BP medication side effects: no Past BP meds: amlodipine  Duration of hyperlipidemia: years Cholesterol medication side effects: no Cholesterol supplements: none Past cholesterol medications: rosuvastatin  (crestor ) Medication compliance: excellent compliance Aspirin : no Recent stressors: no Recurrent headaches: no Visual changes: no Palpitations: no Dyspnea: yes Chest pain: no Lower extremity edema: no Dizzy/lightheaded: occasional  MOOD Patient states she feels like her mood has improved.  She feels like the sertraline  and wellbutrin  are working well for her.  Denies concerns at visit today.  She does feel like she sits on the couch too much.  States she is working on it.  Denies SI.    Depression Screen done today and results listed below:     12/10/2023    9:06 AM 12/05/2022    9:26 AM 06/02/2022    9:51 AM 12/01/2021   10:03 AM 08/31/2021   10:32 AM  Depression screen PHQ 2/9  Decreased Interest 3 3 3 2 2   Down, Depressed, Hopeless 0 3 1 2 3   PHQ - 2 Score 3 6 4 4 5   Altered sleeping 3 3 3 3 1   Tired, decreased energy 3 3 2 2 2   Change in appetite 3 3 3 3 3   Feeling bad or failure about yourself  0 3 0 0 0  Trouble concentrating 0 0 0 0 0  Moving slowly or fidgety/restless 0 0 0 1 1  Suicidal thoughts 0 0 0 0 0  PHQ-9 Score 12 18 12 13 12   Difficult doing work/chores Not difficult at all Somewhat  difficult Very difficult Very difficult Somewhat difficult    The patient does not have a history of falls. I did complete a risk assessment for falls. A plan of care for falls was documented.   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   Budeson-Glycopyrrol-Formoterol (BREZTRI  AEROSPHERE) 160-9-4.8 MCG/ACT AERO INHALE 2 PUFFS IN THE MORNING 2 AT BEDTIME (Patient taking differently:  as needed. INHALE 2 PUFFS IN THE MORNING 2 AT BEDTIME)   calcium  carbonate (TUMS - DOSED IN MG ELEMENTAL CALCIUM ) 500 MG chewable tablet Chew 1 tablet by mouth daily as needed for indigestion or heartburn.    Homeopathic Products (LEG CRAMPS PO) Take 4 tablets by mouth daily.   Magnesium  250 MG TABS Take 400 mg by mouth daily in the afternoon.   Melatonin 10 MG TABS Take 20 mg by mouth at bedtime.   meloxicam  (MOBIC ) 15 MG tablet Take 1 tablet by mouth once daily   Multiple Vitamins-Minerals (PRESERVISION AREDS 2 PO) Take 2 tablets by mouth daily.   No current facility-administered medications on file prior to visit.    Allergies:  Allergies  Allergen Reactions   Atropine  Other (See Comments)    Makes pt hyperactive    Social History:  Social History   Socioeconomic History   Marital status: Married    Spouse name: Not on file   Number of children: Not on file   Years of education: Not on file   Highest education level: Not on file  Occupational History   Not on file  Tobacco Use   Smoking status: Former    Current packs/day: 0.00    Average packs/day: 3.0 packs/day for 11.0 years (33.0 ttl pk-yrs)    Types: Cigarettes    Start date: 03/23/1972    Quit date: 03/24/1983    Years since quitting: 40.7   Smokeless tobacco: Never  Vaping Use   Vaping status: Never Used  Substance and Sexual Activity   Alcohol use: No   Drug use: No   Sexual activity: Not Currently  Other Topics Concern   Not on file  Social History Narrative   Not on file   Social Drivers of Health   Financial Resource Strain: Low Risk  (12/10/2023)   Overall Financial Resource Strain (CARDIA)    Difficulty of Paying Living Expenses: Not hard at all  Food Insecurity: No Food Insecurity (12/10/2023)   Hunger Vital Sign    Worried About Running Out of Food in the Last Year: Never true    Ran Out of Food in the Last Year: Never true  Transportation Needs: No Transportation Needs (12/10/2023)   PRAPARE - Administrator, Civil Service (Medical): No    Lack of Transportation (Non-Medical): No  Physical Activity: Insufficiently Active (12/10/2023)   Exercise Vital Sign    Days of Exercise per Week: 2 days    Minutes of Exercise per Session: 20 min  Stress: No Stress Concern Present (12/10/2023)   [employer] of Occupational Health - Occupational Stress Questionnaire    Feeling of Stress: Not at all  Social Connections: Unknown (12/10/2023)   Social Connection and Isolation Panel    Frequency of Communication with Friends and Family: Not on file    Frequency of Social Gatherings with Friends and Family: Not on file    Attends Religious Services: Not on file     Active Member of Clubs or Organizations: Not on file    Attends Banker Meetings: Not on file    Marital Status: Married  Intimate Partner Violence: Not At Risk (12/10/2023)   Humiliation, Afraid, Rape, and Kick questionnaire    Fear of Current or Ex-Partner: No    Emotionally Abused: No    Physically Abused: No    Sexually Abused: No   Social History   Tobacco Use  Smoking Status Former   Current packs/day: 0.00  Average packs/day: 3.0 packs/day for 11.0 years (33.0 ttl pk-yrs)   Types: Cigarettes   Start date: 03/23/1972   Quit date: 03/24/1983   Years since quitting: 40.7  Smokeless Tobacco Never   Social History   Substance and Sexual Activity  Alcohol Use No    Family History:  Family History  Problem Relation Age of Onset   Cancer Father        lung    Past medical history, surgical history, medications, allergies, family history and social history reviewed with patient today and changes made to appropriate areas of the chart.   Review of Systems  Eyes:  Negative for blurred vision and double vision.  Respiratory:  Positive for shortness of breath.   Cardiovascular:  Negative for chest pain, palpitations and leg swelling.  Neurological:  Positive for dizziness. Negative for headaches.  Psychiatric/Behavioral:  Positive for depression. Negative for suicidal ideas. The patient is nervous/anxious.    All other ROS negative except what is listed above and in the HPI.      Objective:    BP 124/77   Pulse 70   Temp 98.1 F (36.7 C) (Oral)   Ht 5' 3 (1.6 m)   Wt 151 lb (68.5 kg)   SpO2 96%   BMI 26.75 kg/m   Wt Readings from Last 3 Encounters:  12/10/23 151 lb (68.5 kg)  06/05/23 154 lb 6.4 oz (70 kg)  02/07/23 162 lb 6.4 oz (73.7 kg)    Physical Exam Vitals and nursing note reviewed.  Constitutional:      General: She is awake. She is not in acute distress.    Appearance: Normal appearance. She is well-developed and normal weight.  She is not ill-appearing.  HENT:     Head: Normocephalic and atraumatic.     Right Ear: Hearing, tympanic membrane, ear canal and external ear normal. No drainage.     Left Ear: Hearing, tympanic membrane, ear canal and external ear normal. No drainage.     Nose: Nose normal.     Right Sinus: No maxillary sinus tenderness or frontal sinus tenderness.     Left Sinus: No maxillary sinus tenderness or frontal sinus tenderness.     Mouth/Throat:     Mouth: Mucous membranes are moist.     Pharynx: Oropharynx is clear. Uvula midline. No pharyngeal swelling, oropharyngeal exudate or posterior oropharyngeal erythema.  Eyes:     General: Lids are normal.        Right eye: No discharge.        Left eye: No discharge.     Extraocular Movements: Extraocular movements intact.     Conjunctiva/sclera: Conjunctivae normal.     Pupils: Pupils are equal, round, and reactive to light.     Visual Fields: Right eye visual fields normal and left eye visual fields normal.  Neck:     Thyroid: No thyromegaly.     Vascular: No carotid bruit.     Trachea: Trachea normal.  Cardiovascular:     Rate and Rhythm: Normal rate and regular rhythm.     Heart sounds: Normal heart sounds. No murmur heard.    No gallop.  Pulmonary:     Effort: Pulmonary effort is normal. No accessory muscle usage or respiratory distress.     Breath sounds: Normal breath sounds.  Chest:  Breasts:    Right: Normal.     Left: Normal.  Abdominal:     General: Bowel sounds are normal.     Palpations: Abdomen  is soft. There is no hepatomegaly or splenomegaly.     Tenderness: There is no abdominal tenderness.  Musculoskeletal:        General: Normal range of motion.     Cervical back: Normal range of motion and neck supple.     Right lower leg: No edema.     Left lower leg: No edema.  Lymphadenopathy:     Head:     Right side of head: No submental, submandibular, tonsillar, preauricular or posterior auricular adenopathy.     Left  side of head: No submental, submandibular, tonsillar, preauricular or posterior auricular adenopathy.     Cervical: No cervical adenopathy.     Upper Body:     Right upper body: No supraclavicular, axillary or pectoral adenopathy.     Left upper body: No supraclavicular, axillary or pectoral adenopathy.  Skin:    General: Skin is warm and dry.     Capillary Refill: Capillary refill takes less than 2 seconds.     Findings: No rash.  Neurological:     Mental Status: She is alert and oriented to person, place, and time.     Gait: Gait is intact.     Deep Tendon Reflexes: Reflexes are normal and symmetric.     Reflex Scores:      Brachioradialis reflexes are 2+ on the right side and 2+ on the left side.      Patellar reflexes are 2+ on the right side and 2+ on the left side. Psychiatric:        Attention and Perception: Attention normal.        Mood and Affect: Mood normal.        Speech: Speech normal.        Behavior: Behavior normal. Behavior is cooperative.        Thought Content: Thought content normal.        Judgment: Judgment normal.     Results for orders placed or performed in visit on 06/05/23  Comp Met (CMET)   Collection Time: 06/05/23  9:25 AM  Result Value Ref Range   Glucose 92 70 - 99 mg/dL   BUN 12 8 - 27 mg/dL   Creatinine, Ser 8.85 (H) 0.57 - 1.00 mg/dL   eGFR 52 (L) >40 fO/fpw/8.26   BUN/Creatinine Ratio 11 (L) 12 - 28   Sodium 144 134 - 144 mmol/L   Potassium 3.8 3.5 - 5.2 mmol/L   Chloride 106 96 - 106 mmol/L   CO2 22 20 - 29 mmol/L   Calcium  9.2 8.7 - 10.3 mg/dL   Total Protein 6.6 6.0 - 8.5 g/dL   Albumin  4.4 3.9 - 4.9 g/dL   Globulin, Total 2.2 1.5 - 4.5 g/dL   Bilirubin Total 0.3 0.0 - 1.2 mg/dL   Alkaline Phosphatase 75 44 - 121 IU/L   AST 25 0 - 40 IU/L   ALT 22 0 - 32 IU/L  Lipid Profile   Collection Time: 06/05/23  9:25 AM  Result Value Ref Range   Cholesterol, Total 163 100 - 199 mg/dL   Triglycerides 78 0 - 149 mg/dL   HDL 68 >60 mg/dL    VLDL Cholesterol Cal 15 5 - 40 mg/dL   LDL Chol Calc (NIH) 80 0 - 99 mg/dL   Chol/HDL Ratio 2.4 0.0 - 4.4 ratio      Assessment & Plan:   Problem List Items Addressed This Visit       Cardiovascular and Mediastinum   Hypertension  Chronic.  Controlled.  Continue with current medication regimen of Amlodipine  10mg .  Recommend checking blood pressures at home and bring log to next visit. Refills sent today.  Labs ordered today.  Refills sent today.  Return to clinic in 6 months for reevaluation.  Call sooner if concerns arise.       Relevant Medications   amLODipine  (NORVASC ) 10 MG tablet   rosuvastatin  (CRESTOR ) 10 MG tablet     Respiratory   Mucopurulent chronic bronchitis (HCC)   Chronic.  Followed by Pulmonology annually.  Reviewed recent note.  Well controlled with Breztri .  Declined pneumonia shot.  Follow up in 6 months.          Other   Depression, recurrent   Chronic.  Well controlled, PHQ9 improved from last year.  Continue with Zoloft  200mg  and Wellbutrin  300mg  daily.  Labs ordered today.  Refills sent today.  Return to clinic in 6 months for reevaluation.  Call sooner if concerns arise.       Relevant Medications   buPROPion  (WELLBUTRIN  XL) 300 MG 24 hr tablet   sertraline  (ZOLOFT ) 100 MG tablet   Hyperlipidemia   Chronic.  Controlled.  Continue with Crestor  10mg .  Refills sent today.  Labs ordered today.  Return to clinic in 6 months for reevaluation.  Call sooner if concerns arise.       Relevant Medications   amLODipine  (NORVASC ) 10 MG tablet   rosuvastatin  (CRESTOR ) 10 MG tablet   Other Relevant Orders   Lipid panel   Macular degeneration, wet (HCC)   Seeing an Ophthalmologist in [place] and receiving injections.      Other Visit Diagnoses       Annual physical exam    -  Primary   Health maintenance reviewed during visit today.  Labs ordered.  Vaccines reviewed.  Colonoscopy up to date.  Mammogram ordered.  Declined Dexa scan.   Relevant Orders    CBC with Differential/Platelet   Comprehensive metabolic panel with GFR   Lipid panel   TSH     Encounter for screening mammogram for malignant neoplasm of breast       Relevant Orders   MM 3D SCREENING MAMMOGRAM BILATERAL BREAST        Follow up plan: Return in about 6 months (around 06/09/2024) for HTN, HLD, DM2 FU.   LABORATORY TESTING:  - Pap smear: not applicable  IMMUNIZATIONS:   - Tdap: Tetanus vaccination status reviewed: last tetanus booster within 10 years. - Influenza: Postponed to flu season - Pneumovax: Refused - Prevnar: Refused - COVID: Not applicable - HPV: Not applicable - Shingrix vaccine: Refused  SCREENING: -Mammogram: Up to date - Colonoscopy: Up to date - Bone Density: Refused  -Hearing Test: Not applicable  -Spirometry: Not applicable   PATIENT COUNSELING:   Advised to take 1 mg of folate supplement per day if capable of pregnancy.   Sexuality: Discussed sexually transmitted diseases, partner selection, use of condoms, avoidance of unintended pregnancy  and contraceptive alternatives.   Advised to avoid cigarette smoking.  I discussed with the patient that most people either abstain from alcohol or drink within safe limits (<=14/week and <=4 drinks/occasion for males, <=7/weeks and <= 3 drinks/occasion for females) and that the risk for alcohol disorders and other health effects rises proportionally with the number of drinks per week and how often a drinker exceeds daily limits.  Discussed cessation/primary prevention of drug use and availability of treatment for abuse.   Diet: Encouraged to adjust caloric  intake to maintain  or achieve ideal body weight, to reduce intake of dietary saturated fat and total fat, to limit sodium intake by avoiding high sodium foods and not adding table salt, and to maintain adequate dietary potassium and calcium  preferably from fresh fruits, vegetables, and low-fat dairy products.    stressed the importance of  regular exercise  Injury prevention: Discussed safety belts, safety helmets, smoke detector, smoking near bedding or upholstery.   Dental health: Discussed importance of regular tooth brushing, flossing, and dental visits.    NEXT PREVENTATIVE PHYSICAL DUE IN 1 YEAR. Return in about 6 months (around 06/09/2024) for HTN, HLD, DM2 FU.

## 2023-12-10 NOTE — Assessment & Plan Note (Signed)
 Chronic.  Followed by Pulmonology annually.  Reviewed recent note.  Well controlled with Breztri .  Declined pneumonia shot.  Follow up in 6 months.

## 2023-12-10 NOTE — Patient Instructions (Signed)
 Please call to schedule your mammogram and/or bone density: [facility] at [facility]  Address: [address], [place], [place] [address] Phone: [phone]  [facility] at [facility] [address] [place],  [place]  [address] Phone: [phone]

## 2023-12-10 NOTE — Assessment & Plan Note (Signed)
 Chronic.  Controlled.  Continue with current medication regimen of Amlodipine  10mg .  Recommend checking blood pressures at home and bring log to next visit. Refills sent today.  Labs ordered today.  Refills sent today.  Return to clinic in 6 months for reevaluation.  Call sooner if concerns arise.

## 2023-12-10 NOTE — Assessment & Plan Note (Signed)
 Seeing an Ophthalmologist in [place] and receiving injections.

## 2023-12-11 ENCOUNTER — Ambulatory Visit: Payer: Self-pay | Admitting: Nurse Practitioner

## 2023-12-11 DIAGNOSIS — N1831 Chronic kidney disease, stage 3a: Secondary | ICD-10-CM | POA: Insufficient documentation

## 2023-12-11 LAB — CBC WITH DIFFERENTIAL/PLATELET
Basophils Absolute: 0.1 x10E3/uL (ref 0.0–0.2)
Basos: 1 %
EOS (ABSOLUTE): 0.2 x10E3/uL (ref 0.0–0.4)
Eos: 5 %
Hematocrit: 43.4 % (ref 34.0–46.6)
Hemoglobin: 14 g/dL (ref 11.1–15.9)
Immature Grans (Abs): 0 x10E3/uL (ref 0.0–0.1)
Immature Granulocytes: 0 %
Lymphocytes Absolute: 1.1 x10E3/uL (ref 0.7–3.1)
Lymphs: 26 %
MCH: 30.4 pg (ref 26.6–33.0)
MCHC: 32.3 g/dL (ref 31.5–35.7)
MCV: 94 fL (ref 79–97)
Monocytes Absolute: 0.4 x10E3/uL (ref 0.1–0.9)
Monocytes: 8 %
Neutrophils Absolute: 2.6 x10E3/uL (ref 1.4–7.0)
Neutrophils: 60 %
Platelets: 212 x10E3/uL (ref 150–450)
RBC: 4.6 x10E6/uL (ref 3.77–5.28)
RDW: 13.2 % (ref 11.7–15.4)
WBC: 4.4 x10E3/uL (ref 3.4–10.8)

## 2023-12-11 LAB — COMPREHENSIVE METABOLIC PANEL WITH GFR
ALT: 17 IU/L (ref 0–32)
AST: 20 IU/L (ref 0–40)
Albumin: 4.8 g/dL (ref 3.9–4.9)
Alkaline Phosphatase: 76 IU/L (ref 49–135)
BUN/Creatinine Ratio: 11 — ABNORMAL LOW (ref 12–28)
BUN: 13 mg/dL (ref 8–27)
Bilirubin Total: 0.3 mg/dL (ref 0.0–1.2)
CO2: 26 mmol/L (ref 20–29)
Calcium: 9.3 mg/dL (ref 8.7–10.3)
Chloride: 106 mmol/L (ref 96–106)
Creatinine, Ser: 1.23 mg/dL — ABNORMAL HIGH (ref 0.57–1.00)
Globulin, Total: 1.7 g/dL (ref 1.5–4.5)
Glucose: 86 mg/dL (ref 70–99)
Potassium: 3.9 mmol/L (ref 3.5–5.2)
Sodium: 147 mmol/L — ABNORMAL HIGH (ref 134–144)
Total Protein: 6.5 g/dL (ref 6.0–8.5)
eGFR: 48 mL/min/1.73 — ABNORMAL LOW (ref >59)

## 2023-12-11 LAB — LIPID PANEL
Chol/HDL Ratio: 2.1 ratio (ref 0.0–4.4)
Cholesterol, Total: 200 mg/dL — ABNORMAL HIGH (ref 100–199)
HDL: 97 mg/dL (ref >39)
LDL Chol Calc (NIH): 88 mg/dL (ref 0–99)
Triglycerides: 84 mg/dL (ref 0–149)
VLDL Cholesterol Cal: 15 mg/dL (ref 5–40)

## 2023-12-11 LAB — TSH: TSH: 2.04 u[IU]/mL (ref 0.450–4.500)

## 2024-02-24 ENCOUNTER — Other Ambulatory Visit: Payer: Self-pay | Admitting: Nurse Practitioner

## 2024-02-28 ENCOUNTER — Other Ambulatory Visit: Payer: Self-pay | Admitting: Nurse Practitioner

## 2024-02-28 ENCOUNTER — Encounter: Payer: Self-pay | Admitting: Nurse Practitioner

## 2024-02-29 MED ORDER — MELOXICAM 15 MG PO TABS: 15.0000 mg | ORAL_TABLET | Freq: Every day | ORAL | 1 refills | Status: AC

## 2024-03-27 ENCOUNTER — Ambulatory Visit: Admitting: Internal Medicine

## 2024-03-27 LAB — HM MAMMOGRAPHY

## 2024-03-28 ENCOUNTER — Encounter: Payer: Self-pay | Admitting: Nurse Practitioner

## 2024-04-08 ENCOUNTER — Ambulatory Visit: Admitting: Internal Medicine

## 2024-06-10 ENCOUNTER — Ambulatory Visit: Admitting: Nurse Practitioner
